# Patient Record
Sex: Male | Born: 1985 | Race: White | Hispanic: No | Marital: Married | Smoking: Former smoker
Health system: Southern US, Community
[De-identification: ages and names within clinical notes are randomized; demographics above are authoritative.]

## PROBLEM LIST (undated history)

## (undated) DIAGNOSIS — I456 Pre-excitation syndrome: Secondary | ICD-10-CM

## (undated) DIAGNOSIS — T7840XA Allergy, unspecified, initial encounter: Secondary | ICD-10-CM

## (undated) DIAGNOSIS — K5090 Crohn's disease, unspecified, without complications: Secondary | ICD-10-CM

## (undated) DIAGNOSIS — K219 Gastro-esophageal reflux disease without esophagitis: Secondary | ICD-10-CM

## (undated) HISTORY — PX: ANKLE SURGERY: SHX546

## (undated) HISTORY — DX: Gastro-esophageal reflux disease without esophagitis: K21.9

## (undated) HISTORY — PX: COLONOSCOPY: SHX5424

## (undated) HISTORY — DX: Allergy, unspecified, initial encounter: T78.40XA

## (undated) HISTORY — DX: Pre-excitation syndrome: I45.6

---

## 2009-06-10 ENCOUNTER — Encounter: Admission: RE | Admit: 2009-06-10 | Discharge: 2009-06-10 | Payer: Self-pay | Admitting: Emergency Medicine

## 2013-02-01 ENCOUNTER — Other Ambulatory Visit: Payer: Self-pay | Admitting: Gastroenterology

## 2013-02-01 DIAGNOSIS — R109 Unspecified abdominal pain: Secondary | ICD-10-CM

## 2013-02-08 ENCOUNTER — Ambulatory Visit
Admission: RE | Admit: 2013-02-08 | Discharge: 2013-02-08 | Disposition: A | Discharge status: Home / Self Care | Payer: BC Managed Care – PPO | Source: Ambulatory Visit | Attending: Gastroenterology | Admitting: Gastroenterology

## 2013-02-08 DIAGNOSIS — R109 Unspecified abdominal pain: Secondary | ICD-10-CM

## 2014-05-10 HISTORY — PX: BOWEL RESECTION: SHX1257

## 2014-05-16 ENCOUNTER — Ambulatory Visit (INDEPENDENT_AMBULATORY_CARE_PROVIDER_SITE_OTHER): Payer: BLUE CROSS/BLUE SHIELD | Admitting: Family Medicine

## 2014-05-16 ENCOUNTER — Encounter: Payer: Self-pay | Admitting: Family Medicine

## 2014-05-16 VITALS — BP 124/76 | HR 60 | Ht 71.0 in | Wt 199.0 lb

## 2014-05-16 DIAGNOSIS — G8929 Other chronic pain: Secondary | ICD-10-CM | POA: Insufficient documentation

## 2014-05-16 DIAGNOSIS — R1013 Epigastric pain: Secondary | ICD-10-CM

## 2014-05-16 MED ORDER — AMOXICILLIN 875 MG PO TABS: 875.0000 mg | ORAL_TABLET | Freq: Two times a day (BID) | ORAL | Status: DC

## 2014-05-16 MED ORDER — CLARITHROMYCIN 500 MG PO TABS: 500.0000 mg | ORAL_TABLET | Freq: Two times a day (BID) | ORAL | Status: DC

## 2014-05-16 MED ORDER — ESOMEPRAZOLE MAGNESIUM 40 MG PO CPDR: 40.0000 mg | DELAYED_RELEASE_CAPSULE | Freq: Every day | ORAL | Status: DC

## 2014-05-16 NOTE — Assessment & Plan Note (Signed)
Likely GERD with patient inconsistency takes medications. Due to pain, would feel treating for H pylori to be safe.  Will not get labs because patient states that he has had it before but never got the results and did set him back but initially. Patient is going to try this as well as taking Nexium at least 3 times a week after the treatment. Patient come back in 4 weeks for further evaluation. If patient continues to have difficulty we may need to consider another workup by gastroenterology.

## 2014-05-16 NOTE — Progress Notes (Signed)
  [facility] [place], [place] [address] Phone: [phone] Subjective:     CC: abdominal issues.   VZC:HYIFOYDXAJ Anthony Reed is a 29 y.o. 29 y.o. male coming in with complaint of  abdominal issues. Patient has had this intermittent for multiple years. Patient has had workup by gastroenterology before and did have a EGD done showing patient did have a hiatal hernia as well as some mild ulcers. Patient had been taking Nexium previously. Patient states from time to time he has an exacerbation where he has significant abdominal pain that does not allow him to do any activity. Patient states that this is more of a diffuse pain with no specific location. Patient states that it is severe as 8 out of 10 where he cannot even play with his kids. Denies any bowel changes. Sometimes associated with nausea as well as vomiting. States that these are fairly infrequent usually only 1-2 times a month. Patient has not notice any association with food or lack of food, alcohol, or medications. Patient does travel so his diet is inconsistent. Denies any fevers chills or any abnormal weight loss.     Past medical history, social, surgical and family history all reviewed in electronic medical record.   Review of Systems: No headache, visual changes, nausea, vomiting, diarrhea, constipation, dizziness, abdominal pain, skin rash, fevers, chills, night sweats, weight loss, swollen lymph nodes, body aches, joint swelling, muscle aches, chest pain, shortness of breath, mood changes.   Objective Blood pressure 124/76, pulse 60, height 5' 11"  (1.803 m), weight 199 lb (90.266 kg), SpO2 99 %.  General: No apparent distress alert and oriented x3 mood and affect normal, dressed appropriately.  HEENT: Pupils equal, extraocular movements intact  Respiratory: Patient's speak in full sentences and does not appear short of breath  Cardiovascular: No lower extremity edema, non tender, no erythema    Skin: Warm dry intact with no signs of infection or rash on extremities or on axial skeleton.  Abdomen: Soft nontender, BS +, No masses palpated.  Neuro: Cranial nerves II through XII are intact, neurovascularly intact in all extremities with 2+ DTRs and 2+ pulses.  Lymph: No lymphadenopathy of posterior or anterior cervical chain or axillae bilaterally.  Gait normal with good balance and coordination.  MSK:  Non tender with full range of motion and good stability and symmetric strength and tone of shoulders, elbows, wrist, hip, knee and ankles bilaterally.     Impression and Recommendations:     This case required medical decision making of moderate complexity.

## 2014-05-16 NOTE — Patient Instructions (Addendum)
Good to meet you.  We will treat for H pylori for next 2 weeks.  Nexium then daily for 2 weeks after that then 3 times weekly.  Either come back and see me or call in 4 weeks and tell me how you are doing.

## 2014-09-19 LAB — HM COLONOSCOPY

## 2014-11-02 IMAGING — US US ABDOMEN COMPLETE
1 series · 13 of 25 positions shown · non-contrast
Comparison: None.

ADDENDUM:
The impression is incorrect. There was slight increased echogenicity
of the parenchyma of the liver compared to the right kidney. This
may be associated with hepatic steatosis but is not specific for it.
CLINICAL DATA: History of abdominal pain.

EXAM:
ULTRASOUND ABDOMEN COMPLETE

[Series 1: us abdomen complete · 0.24mm/px · 13 of 82 slices shown]
[im 1/82]
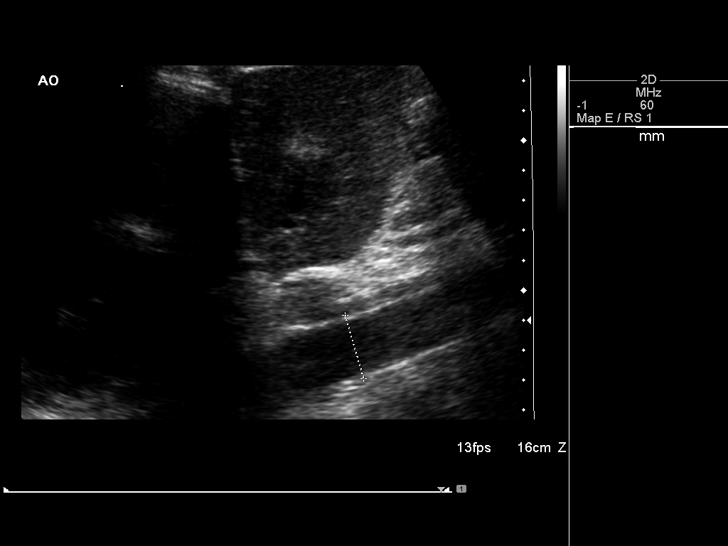
[im 7/82]
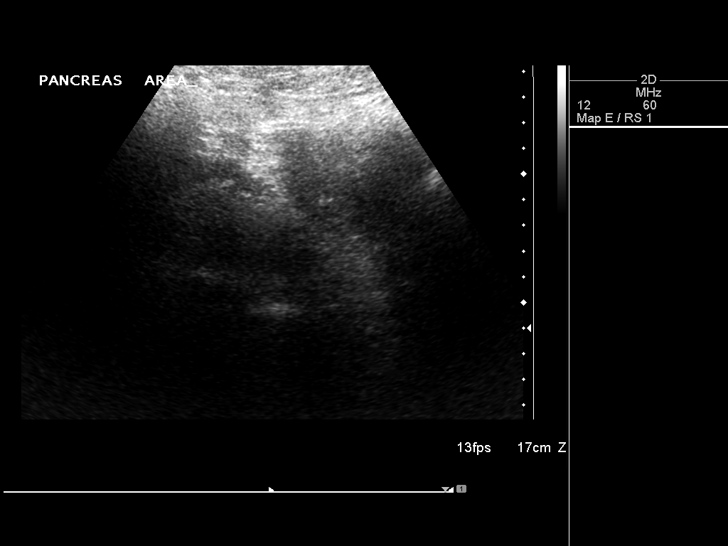
[im 14/82]
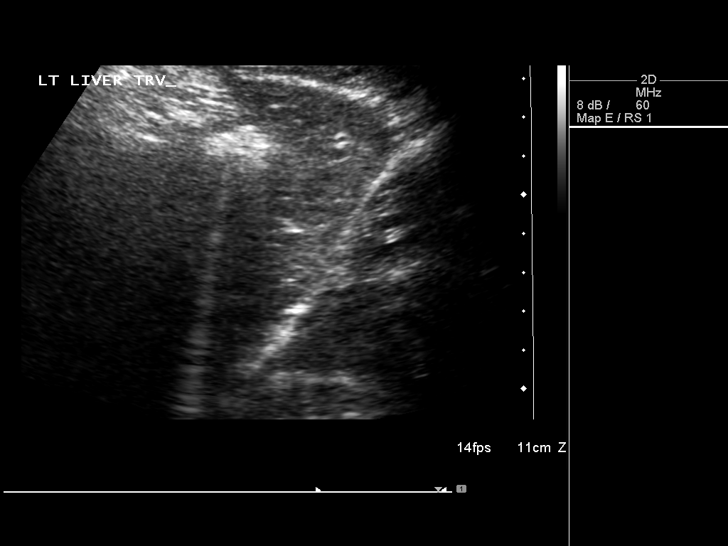
[im 21/82]
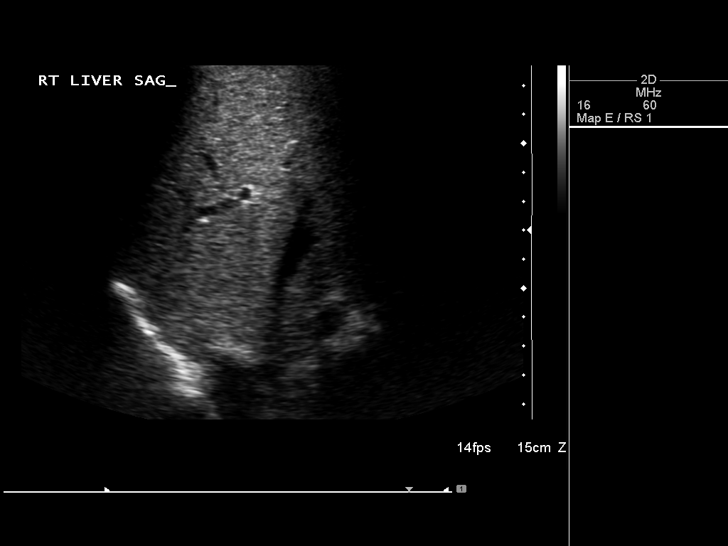
[im 28/82]
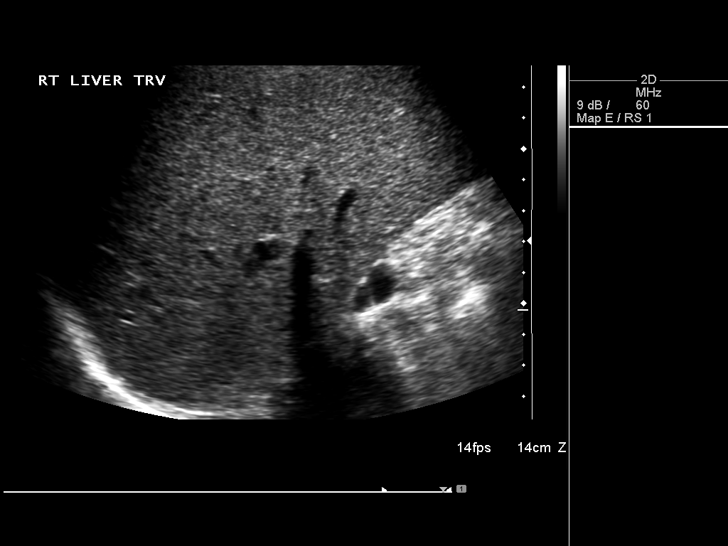
[im 34/82]
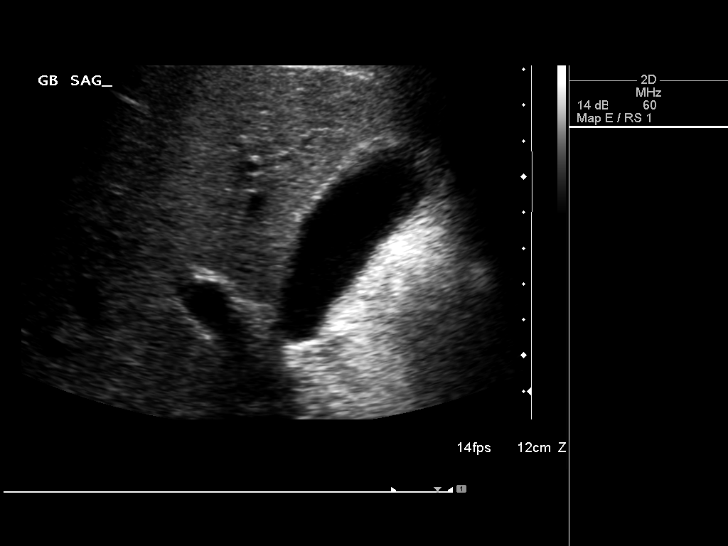
[im 41/82]
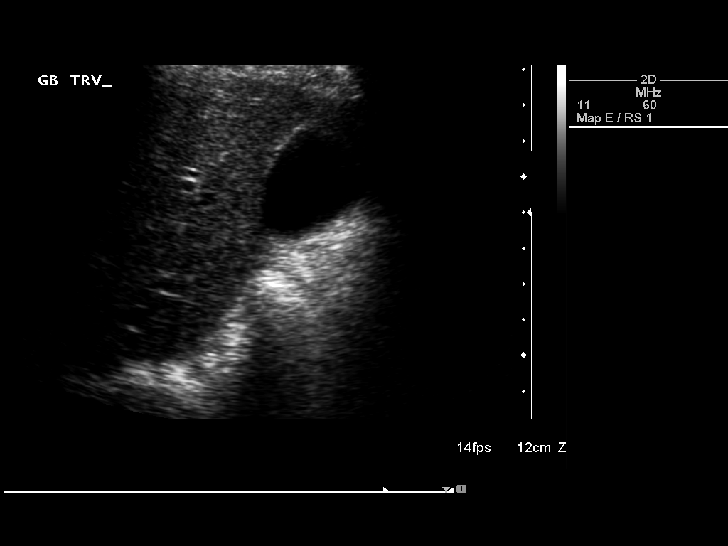
[im 48/82]
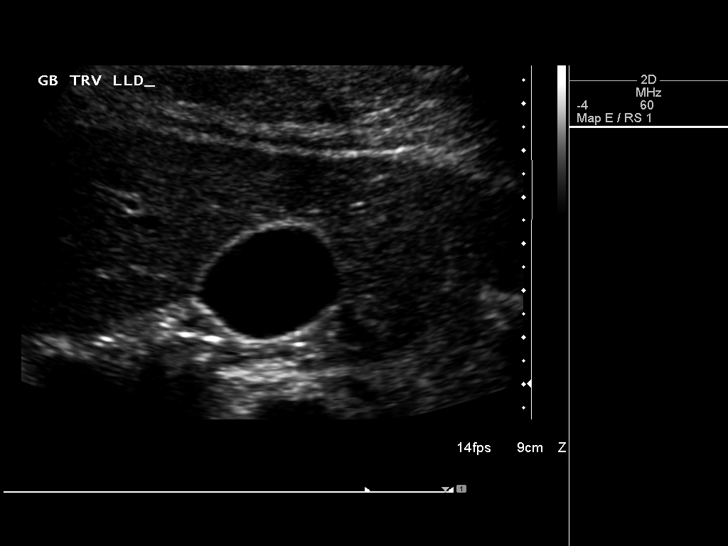
[im 55/82]
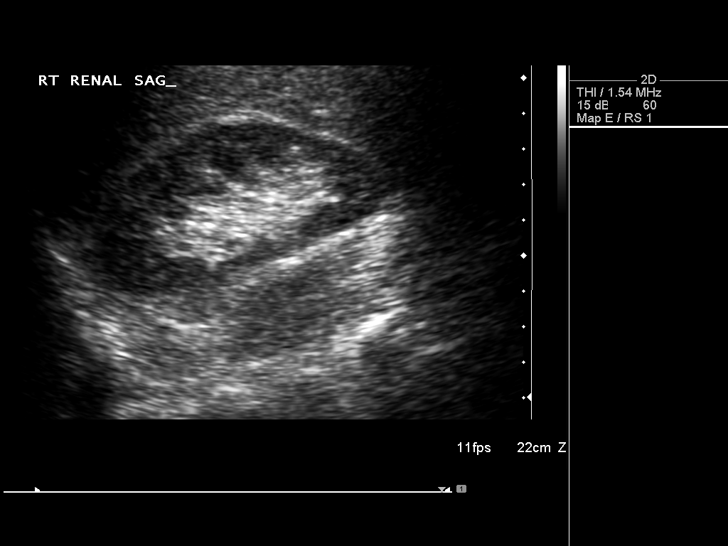
[im 61/82]
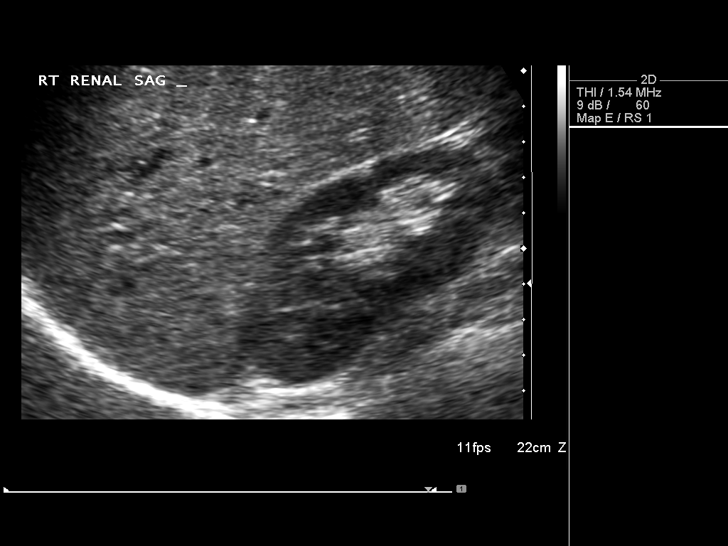
[im 68/82]
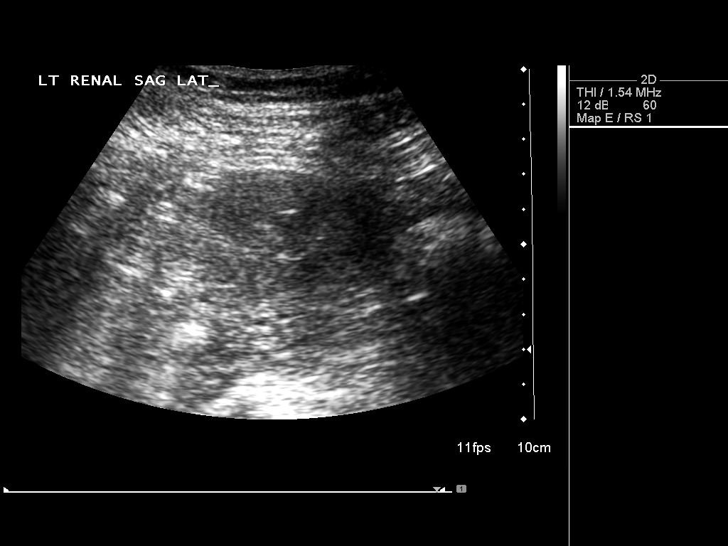
[im 75/82]
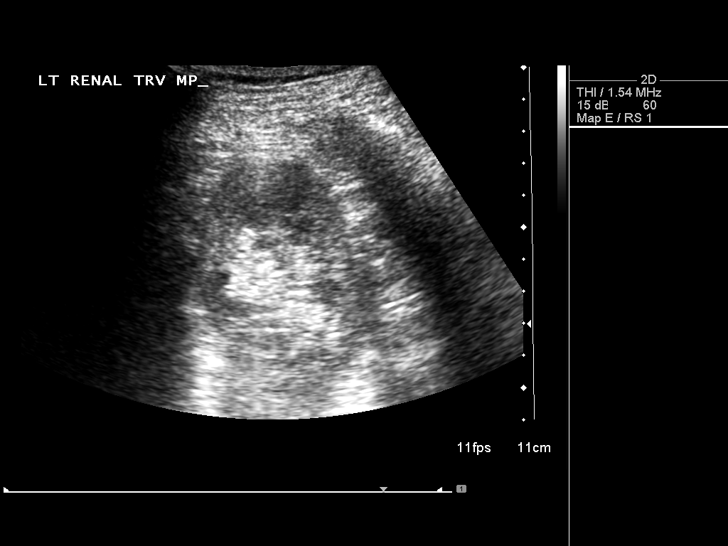
[im 82/82]
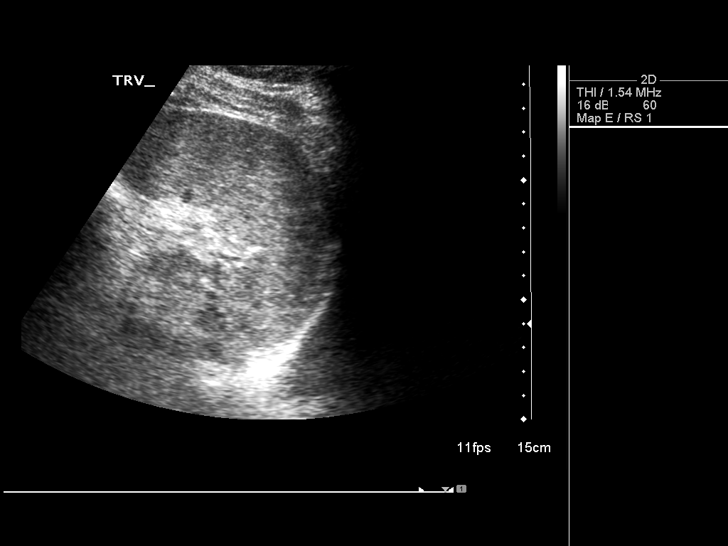

[13 of 25 positions shown; findings below may reference images not displayed]

FINDINGS: Gallbladder

No cholelithiasis is evident. Gallbladder wall thickness is normal
measuring 1.8 cm. No pericholecystic fluid is seen. There was a
negative sonographic Murphy's sign according to technologist

Common bile duct

Diameter: Common bile duct measured 3 mm. No choledocholithiasis is
evident.

Liver

No focal lesion identified. There is increased echogenicity of the
parenchyma of the liver compared to the right kidney. Portal vein is
patent with hepatopetal flow.

IVC

No abnormality visualized. Some portions were obscured by bowel gas.

Pancreas

Pancreatic tissue was obscured by overlying bowel gas.

Spleen

Size and appearance within normal limits. Length is 9.1 cm. No focal
abnormality is identified.

Right Kidney

Length: Right renal length is 9.8 cm. Echogenicity within normal
limits. No mass or hydronephrosis visualized.

Left Kidney

Length: Left renal length is 10.2 cm. Echogenicity within normal
limits. No mass or hydronephrosis visualized.

Abdominal aorta

No aneurysm visualized.  Maximum diameter is 2.2 cm.
IMPRESSION: Some portions of the abdomen were obscured by bowel gas. No
abdominal pathology is identified.

## 2015-03-24 NOTE — Progress Notes (Signed)
 Small amount of nausea but no more emesis today. Having small bowel movements. Abdomen is benign. Based on CT scan and history, wondering whether he may have early recurrent Crohn's disease of the small bowel. Tomorrow going to order an MR enterography to further evaluate. He may wire initiation of steroids or other Crohn's medications.

## 2015-04-02 LAB — HEPATITIS B SURFACE ANTIBODY,QUALITATIVE
HBsAg Screen: NEGATIVE
Hep B Surface Ab, Qual: REACTIVE

## 2015-05-01 ENCOUNTER — Other Ambulatory Visit: Payer: Self-pay | Admitting: Family Medicine

## 2015-05-01 NOTE — Telephone Encounter (Signed)
Refill denied. Pt has not been seen since January 2016.

## 2015-05-27 ENCOUNTER — Other Ambulatory Visit: Payer: Self-pay | Admitting: Family Medicine

## 2015-05-27 NOTE — Telephone Encounter (Signed)
Pt has not been seen within a year.  Refill denied.

## 2015-09-16 ENCOUNTER — Encounter (HOSPITAL_COMMUNITY): Payer: Self-pay

## 2015-09-16 ENCOUNTER — Emergency Department (HOSPITAL_COMMUNITY)
Admission: EM | Admit: 2015-09-16 | Discharge: 2015-09-16 | Disposition: A | Discharge status: Home / Self Care | Payer: BLUE CROSS/BLUE SHIELD | Attending: Emergency Medicine | Admitting: Emergency Medicine

## 2015-09-16 DIAGNOSIS — K5090 Crohn's disease, unspecified, without complications: Secondary | ICD-10-CM

## 2015-09-16 DIAGNOSIS — R109 Unspecified abdominal pain: Secondary | ICD-10-CM | POA: Diagnosis present

## 2015-09-16 DIAGNOSIS — R17 Unspecified jaundice: Secondary | ICD-10-CM

## 2015-09-16 DIAGNOSIS — Z79899 Other long term (current) drug therapy: Secondary | ICD-10-CM | POA: Diagnosis not present

## 2015-09-16 HISTORY — DX: Crohn's disease, unspecified, without complications: K50.90

## 2015-09-16 LAB — COMPREHENSIVE METABOLIC PANEL
ALBUMIN: 4.5 g/dL (ref 3.5–5.0)
ALK PHOS: 55 U/L (ref 38–126)
ALT: 26 U/L (ref 17–63)
ANION GAP: 13 (ref 5–15)
AST: 28 U/L (ref 15–41)
BILIRUBIN TOTAL: 3 mg/dL — AB (ref 0.3–1.2)
BUN: 17 mg/dL (ref 6–20)
CALCIUM: 9.6 mg/dL (ref 8.9–10.3)
CO2: 22 mmol/L (ref 22–32)
CREATININE: 1.15 mg/dL (ref 0.61–1.24)
Chloride: 107 mmol/L (ref 101–111)
GFR calc Af Amer: >60 mL/min (ref >60)
GFR calc non Af Amer: >60 mL/min (ref >60)
GLUCOSE: 88 mg/dL (ref 65–99)
Potassium: 4 mmol/L (ref 3.5–5.1)
Sodium: 142 mmol/L (ref 135–145)
TOTAL PROTEIN: 7 g/dL (ref 6.5–8.1)

## 2015-09-16 LAB — CBC
HCT: 44.8 % (ref 39.0–52.0)
Hemoglobin: 15.6 g/dL (ref 13.0–17.0)
MCH: 29.2 pg (ref 26.0–34.0)
MCHC: 34.8 g/dL (ref 30.0–36.0)
MCV: 83.9 fL (ref 78.0–100.0)
PLATELETS: 250 10*3/uL (ref 150–400)
RBC: 5.34 MIL/uL (ref 4.22–5.81)
RDW: 14.2 % (ref 11.5–15.5)
WBC: 8.2 10*3/uL (ref 4.0–10.5)

## 2015-09-16 LAB — LIPASE, BLOOD: Lipase: 29 U/L (ref 11–51)

## 2015-09-16 MED ORDER — FENTANYL CITRATE (PF) 100 MCG/2ML IJ SOLN
50.0000 ug | INTRAMUSCULAR | Status: DC | PRN
  Administered 2015-09-16: 50 ug via NASAL

## 2015-09-16 MED ORDER — KETOROLAC TROMETHAMINE 15 MG/ML IJ SOLN
15.0000 mg | Freq: Once | INTRAMUSCULAR | Status: AC
  Administered 2015-09-16: 15 mg via INTRAVENOUS
  Filled 2015-09-16: qty 1

## 2015-09-16 MED ORDER — FENTANYL CITRATE (PF) 100 MCG/2ML IJ SOLN
INTRAMUSCULAR | Status: AC
  Filled 2015-09-16: qty 2

## 2015-09-16 MED ORDER — HYDROMORPHONE HCL 1 MG/ML IJ SOLN
1.0000 mg | Freq: Once | INTRAMUSCULAR | Status: AC
Start: 2015-09-16 — End: 2015-09-16
  Administered 2015-09-16: 1 mg via INTRAVENOUS
  Filled 2015-09-16: qty 1

## 2015-09-16 MED ORDER — ONDANSETRON 4 MG PO TBDP
4.0000 mg | ORAL_TABLET | Freq: Once | ORAL | Status: AC | PRN
  Administered 2015-09-16: 4 mg via ORAL

## 2015-09-16 MED ORDER — SODIUM CHLORIDE 0.9 % IV BOLUS (SEPSIS)
1000.0000 mL | Freq: Once | INTRAVENOUS | Status: AC
  Administered 2015-09-16: 1000 mL via INTRAVENOUS

## 2015-09-16 MED ORDER — KETOROLAC TROMETHAMINE 10 MG PO TABS: 10.0000 mg | ORAL_TABLET | Freq: Four times a day (QID) | ORAL | Status: DC | PRN

## 2015-09-16 MED ORDER — OXYCODONE-ACETAMINOPHEN 5-325 MG PO TABS
1.0000 | ORAL_TABLET | Freq: Once | ORAL | Status: AC
  Administered 2015-09-16: 1 via ORAL
  Filled 2015-09-16: qty 1

## 2015-09-16 MED ORDER — ONDANSETRON 4 MG PO TBDP
ORAL_TABLET | ORAL | Status: AC
  Filled 2015-09-16: qty 1

## 2015-09-16 NOTE — ED Notes (Signed)
Pt here with c/o all over abdominal pain/cramping, onset 2 hours ago. He has Crohn's disease and this flare up much worse. He reports nausea but denies v/D.

## 2015-09-16 NOTE — Discharge Instructions (Signed)
Take the meds prescribed. All the ER labs are reassuring.  Please return to the ER if your symptoms worsen; you have increased pain, fevers, chills, inability to keep any medications down. Otherwise see the outpatient doctor as requested.   Crohn Disease Crohn disease is a long-lasting (chronic) disease that affects your gastrointestinal (GI) tract. It often causes irritation and swelling (inflammation) in your small intestine and the beginning of your large intestine. However, it can affect any part of your GI tract. Crohn disease is part of a group of illnesses that are known as inflammatory bowel disease (IBD). Crohn disease may start slowly and get worse over time. Symptoms may come and go. They may also disappear for months or even years at a time (remission). CAUSES The exact cause of Crohn disease is not known. It may be a response that causes your body's defense system (immune system) to mistakenly attack healthy cells and tissues (autoimmune response). Your genes and your environment may also play a role. RISK FACTORS You may be at greater risk for Crohn disease if you:  Have other family members with Crohn disease or another IBD.  Use any tobacco products, including cigarettes, chewing tobacco, or electronic cigarettes.  Are in your 7s.  Have [place] European ancestry. SIGNS AND SYMPTOMS The main signs and symptoms of Crohn disease involve your GI tract. These include:  Diarrhea.  Rectal bleeding.  An urgent need to move your bowels.  The feeling that you are not finished having a bowel movement.  Abdominal pain or cramping.  Constipation. General signs and symptoms of Crohn disease may also include:  Unexplained weight loss.  Fatigue.  Fever.  Nausea.  Loss of appetite.  Joint pain  Changes in vision.  Red bumps on your skin. DIAGNOSIS Your health care provider may suspect Crohn disease based on your symptoms and your medical history. Your health care  provider will do a physical exam. You may need to see a health care provider who specializes in diseases of the digestive tract (gastroenterologist). You may also have tests to help your health care providers make a diagnosis. These may include:  Blood tests.  Stool sample tests.  Imaging tests, such as X-rays and CT scans.  Tests to examine the inside of your intestines using a long, flexible tube that has a light and a camera on the end (endoscopy or colonoscopy).  A procedure to take tissue samples from inside your bowel (biopsy) to be examined under a microscope. TREATMENT  There is no cure for Crohn disease. Treatment will focus on managing your symptoms. Crohn disease affects each person differently. Your treatment may include:  Resting your bowels. Drinking only clear liquids or getting nutrition through an IV for a period of time gives your bowels a chance to heal because they are not passing stools.  Medicines. These may be used alone or in combination (combination therapy). These may include antibiotic medicines. You may be given medicines that help to:  Reduce inflammation.  Control your immune system activity.  Fight infections.  Relieve cramps and prevent diarrhea.  Control your pain.  Surgery. You may need surgery if:  Medicines and other treatments are no longer working.  You develop complications from severe Crohn disease.  A section of your intestine becomes so damaged that it needs to be removed. HOME CARE INSTRUCTIONS  Take medicines only as directed by your health care provider.  If you were prescribed an antibiotic medicine, finish it all even if you start to  feel better.  Keep all follow-up visits as directed by your health care provider. This is important.  Talk with your health care provider about changing your diet. This may help your symptoms. Your health care provide may recommend changes, such as:  Drinking more fluids.  Avoiding milk and  other foods that contain lactose.  Eating a low-fat diet.  Avoiding high-fiber foods, such as popcorn and nuts.  Avoiding carbonated beverages, such as soda.  Eating smaller meals more often rather than eating large meals.  Keeping a food diary to identify foods that make your symptoms better or worse.  Do not use any tobacco products, including cigarettes, chewing tobacco, or electronic cigarettes. If you need help quitting, ask your health care provider.  Limit alcohol intake to no more than 1 drink per day for nonpregnant women and 2 drinks per day for men. One drink equals 12 ounces of beer, 5 ounces of wine, or 1 ounces of hard liquor.  Exercise daily or as directed by your health care provider. SEEK MEDICAL CARE IF:  You have diarrhea, abdominal cramps, and other gastrointestinal problems that are present almost all of the time.  Your symptoms do not improve with treatment.  You continue to lose weight.  You develop a rash or sores on your skin.  You develop eye problems.  You have a fever.   Your symptoms get worse.  You develop new symptoms. SEEK IMMEDIATE MEDICAL CARE IF:  You have bloody diarrhea.  You develop severe abdominal pain.  You cannot pass stools.   This information is not intended to replace advice given to you by your health care provider. Make sure you discuss any questions you have with your health care provider.   Document Released: 02/03/2005 Document Revised: 05/17/2014 Document Reviewed: 12/12/2013 Elsevier Interactive Patient Education [employer].

## 2015-09-16 NOTE — ED Provider Notes (Signed)
CSN: [id number]     Arrival date & time 09/16/15  1646 History   First MD Initiated Contact with Patient 09/16/15 1911     Chief Complaint  Patient presents with  . Abdominal Pain     (Consider location/radiation/quality/duration/timing/severity/associated sxs/prior Treatment) HPI Comments: Pt comes in with cc of abd pain. Abd pain started around 2:30, unprovoked. Pain is described as sharp pain, non radiating. No associated n/v/f/c/diarrhea. Pt has hx of crohns, managed at [facility]. Pt has had 2 admissions in the last 6 months and colon resection. Not on chronic pain meds. Pt is on humira.   ROS 10 Systems reviewed and are negative for acute change except as noted in the HPI.     Patient is a 30 y.o. male presenting with abdominal pain. The history is provided by the patient.  Abdominal Pain   Past Medical History  Diagnosis Date  . Crohn disease ([place])    History reviewed. No pertinent past surgical history. No family history on file. Social History  Substance Use Topics  . Smoking status: Never Smoker   . Smokeless tobacco: None  . Alcohol Use: None    Review of Systems  Gastrointestinal: Positive for abdominal pain.      Allergies  Review of patient's allergies indicates no known allergies.  Home Medications   Prior to Admission medications   Medication Sig Start Date End Date Taking? Authorizing Provider  Adalimumab (HUMIRA) 40 MG/0.8ML PSKT Inject 40 mg into the skin every 14 (fourteen) days.   Yes Historical Provider, MD  Ascorbic Acid (VITAMIN C PO) Take 1 tablet by mouth daily.   Yes Historical Provider, MD  esomeprazole (NEXIUM) 40 MG capsule Take 1 capsule (40 mg total) by mouth daily. 05/16/14  Yes Lyndal Pulley, DO  Omega-3 Fatty Acids (FISH OIL PO) Take 1 capsule by mouth daily.   Yes Historical Provider, MD  Probiotic Product (PROBIOTIC PO) Take 1 tablet by mouth daily.   Yes Historical Provider, MD  ketorolac (TORADOL) 10 MG tablet Take 1  tablet (10 mg total) by mouth every 6 (six) hours as needed. 09/16/15   My Madariaga, MD   BP 181/100 mmHg  Pulse 62  Temp(Src) 98.8 F (37.1 C) (Oral)  Resp 14  Ht 5' 11"  (1.803 m)  Wt 190 lb (86.183 kg)  BMI 26.51 kg/m2  SpO2 100% Physical Exam  Constitutional: He is oriented to person, place, and time. He appears well-developed.  HENT:  Head: Atraumatic.  Neck: Neck supple.  Cardiovascular: Normal rate.   Pulmonary/Chest: Effort normal.  Abdominal: Soft. Bowel sounds are normal. There is tenderness. There is no rebound and no guarding.  Periumbilical tenderness  Neurological: He is alert and oriented to person, place, and time.  Skin: Skin is warm.  Nursing note and vitals reviewed.   ED Course  Procedures (including critical care time) Labs Review Labs Reviewed  COMPREHENSIVE METABOLIC PANEL - Abnormal; Notable for the following:    Total Bilirubin 3.0 (*)    All other components within normal limits  LIPASE, BLOOD  CBC  URINALYSIS, ROUTINE W REFLEX MICROSCOPIC (NOT AT [facility])    Imaging Review No results found. I have personally reviewed and evaluated these images and lab results as part of my medical decision-making.   EKG Interpretation None      MDM   Final diagnoses:  Crohn's disease without complication, unspecified gastrointestinal tract location ([place])  Elevated bilirubin    Pt comes in with cc of abd pain.  Abd pain is periumbilical, no peritoneal signs. VSS and WNL, and the labs are benign except for elevated bilirubin which is not specific. Based on the labs and exam - no indication for CT currently. Will give pain meds, hydrate and reassess.  9:34 PM Pt is a lot comfortable and essentially pain free. Will d/c. Strict ER return precautions have been discussed, and patient is agreeing with the plan and is comfortable with the workup done and the recommendations from the ER.      Varney Biles, MD 09/16/15 2134

## 2015-09-16 NOTE — ED Notes (Signed)
PAIN MEDICATION ADMINISTERED IN TRIAGE AND PT INSTRUCTED NO DRINKING, DRIVING OR OPERATING HEAVY MACHINERY WITH 4-6 HOURS. POT MOM WILL DRIVE HIM HOME IF DISCHARGED

## 2016-03-24 LAB — IRON,TIBC AND FERRITIN PANEL
%SAT: 20
Ferritin: 20
Iron: 78
TIBC: 388
UIBC: 310

## 2016-03-24 LAB — C-REACTIVE PROTEIN, QUANT: C-Reactive Protein, Quant: 0.4

## 2016-03-24 LAB — FOLATE: Folic Acid: 15.6

## 2016-03-24 LAB — HEPATIC FUNCTION PANEL
ALT: 26 (ref 10–40)
AST: 33 (ref 14–40)
Alkaline Phosphatase: 64 (ref 25–125)
Bilirubin, Total: 1.8

## 2016-03-24 LAB — BASIC METABOLIC PANEL
BUN: 19 (ref 4–21)
Creatinine: 1.1 (ref 0.6–1.3)
Glucose: 84
Potassium: 4.7 (ref 3.4–5.3)
Sodium: 141 (ref 137–147)

## 2016-03-24 LAB — VITAMIN B12: Vitamin B-12: 332

## 2016-03-24 LAB — CBC AND DIFFERENTIAL
HCT: 44 (ref 41–53)
Hemoglobin: 14.9 (ref 13.5–17.5)
Neutrophils Absolute: 5
Platelets: 275 (ref 150–399)
WBC: 9

## 2017-02-22 DIAGNOSIS — K921 Melena: Secondary | ICD-10-CM | POA: Diagnosis not present

## 2017-03-26 DIAGNOSIS — K625 Hemorrhage of anus and rectum: Secondary | ICD-10-CM | POA: Diagnosis not present

## 2017-03-26 DIAGNOSIS — R109 Unspecified abdominal pain: Secondary | ICD-10-CM | POA: Diagnosis not present

## 2017-03-26 DIAGNOSIS — R1084 Generalized abdominal pain: Secondary | ICD-10-CM | POA: Diagnosis not present

## 2017-03-26 DIAGNOSIS — K219 Gastro-esophageal reflux disease without esophagitis: Secondary | ICD-10-CM | POA: Diagnosis not present

## 2017-03-26 DIAGNOSIS — R112 Nausea with vomiting, unspecified: Secondary | ICD-10-CM | POA: Diagnosis not present

## 2017-03-26 DIAGNOSIS — K449 Diaphragmatic hernia without obstruction or gangrene: Secondary | ICD-10-CM | POA: Diagnosis not present

## 2017-03-26 DIAGNOSIS — Z79899 Other long term (current) drug therapy: Secondary | ICD-10-CM | POA: Diagnosis not present

## 2017-03-26 DIAGNOSIS — Z87891 Personal history of nicotine dependence: Secondary | ICD-10-CM | POA: Diagnosis not present

## 2017-03-26 DIAGNOSIS — K921 Melena: Secondary | ICD-10-CM | POA: Diagnosis not present

## 2017-03-26 DIAGNOSIS — K50912 Crohn's disease, unspecified, with intestinal obstruction: Secondary | ICD-10-CM | POA: Diagnosis not present

## 2017-03-26 DIAGNOSIS — K50012 Crohn's disease of small intestine with intestinal obstruction: Secondary | ICD-10-CM | POA: Diagnosis not present

## 2017-03-26 DIAGNOSIS — Z4682 Encounter for fitting and adjustment of non-vascular catheter: Secondary | ICD-10-CM | POA: Diagnosis not present

## 2017-03-26 DIAGNOSIS — K56600 Partial intestinal obstruction, unspecified as to cause: Secondary | ICD-10-CM | POA: Diagnosis not present

## 2017-05-16 DIAGNOSIS — K5090 Crohn's disease, unspecified, without complications: Secondary | ICD-10-CM | POA: Diagnosis not present

## 2017-05-16 DIAGNOSIS — R509 Fever, unspecified: Secondary | ICD-10-CM | POA: Diagnosis not present

## 2017-05-16 DIAGNOSIS — K56609 Unspecified intestinal obstruction, unspecified as to partial versus complete obstruction: Secondary | ICD-10-CM | POA: Diagnosis not present

## 2017-05-16 DIAGNOSIS — K56600 Partial intestinal obstruction, unspecified as to cause: Secondary | ICD-10-CM | POA: Diagnosis not present

## 2017-05-16 DIAGNOSIS — K6389 Other specified diseases of intestine: Secondary | ICD-10-CM | POA: Diagnosis not present

## 2017-05-16 DIAGNOSIS — R109 Unspecified abdominal pain: Secondary | ICD-10-CM | POA: Diagnosis not present

## 2017-05-16 DIAGNOSIS — Z9049 Acquired absence of other specified parts of digestive tract: Secondary | ICD-10-CM | POA: Diagnosis not present

## 2017-05-16 DIAGNOSIS — K219 Gastro-esophageal reflux disease without esophagitis: Secondary | ICD-10-CM | POA: Diagnosis not present

## 2017-05-16 DIAGNOSIS — K449 Diaphragmatic hernia without obstruction or gangrene: Secondary | ICD-10-CM | POA: Diagnosis not present

## 2017-05-16 DIAGNOSIS — Z87891 Personal history of nicotine dependence: Secondary | ICD-10-CM | POA: Diagnosis not present

## 2017-05-16 DIAGNOSIS — R1013 Epigastric pain: Secondary | ICD-10-CM | POA: Diagnosis not present

## 2017-05-16 DIAGNOSIS — K50919 Crohn's disease, unspecified, with unspecified complications: Secondary | ICD-10-CM | POA: Diagnosis not present

## 2017-05-16 DIAGNOSIS — Z79899 Other long term (current) drug therapy: Secondary | ICD-10-CM | POA: Diagnosis not present

## 2017-05-16 DIAGNOSIS — R112 Nausea with vomiting, unspecified: Secondary | ICD-10-CM | POA: Diagnosis not present

## 2017-05-27 DIAGNOSIS — K921 Melena: Secondary | ICD-10-CM | POA: Diagnosis not present

## 2017-05-27 DIAGNOSIS — K50012 Crohn's disease of small intestine with intestinal obstruction: Secondary | ICD-10-CM | POA: Diagnosis not present

## 2017-05-27 DIAGNOSIS — K219 Gastro-esophageal reflux disease without esophagitis: Secondary | ICD-10-CM | POA: Diagnosis not present

## 2017-06-20 DIAGNOSIS — K56609 Unspecified intestinal obstruction, unspecified as to partial versus complete obstruction: Secondary | ICD-10-CM | POA: Diagnosis not present

## 2017-06-20 DIAGNOSIS — Z79899 Other long term (current) drug therapy: Secondary | ICD-10-CM | POA: Diagnosis not present

## 2017-06-20 DIAGNOSIS — K50812 Crohn's disease of both small and large intestine with intestinal obstruction: Secondary | ICD-10-CM | POA: Diagnosis not present

## 2017-06-20 DIAGNOSIS — Z9049 Acquired absence of other specified parts of digestive tract: Secondary | ICD-10-CM | POA: Diagnosis not present

## 2017-07-21 DIAGNOSIS — K56600 Partial intestinal obstruction, unspecified as to cause: Secondary | ICD-10-CM | POA: Diagnosis not present

## 2017-07-21 DIAGNOSIS — K50019 Crohn's disease of small intestine with unspecified complications: Secondary | ICD-10-CM | POA: Diagnosis not present

## 2017-07-25 DIAGNOSIS — K50019 Crohn's disease of small intestine with unspecified complications: Secondary | ICD-10-CM | POA: Diagnosis not present

## 2017-07-27 DIAGNOSIS — N451 Epididymitis: Secondary | ICD-10-CM | POA: Diagnosis not present

## 2017-07-27 DIAGNOSIS — R300 Dysuria: Secondary | ICD-10-CM | POA: Diagnosis not present

## 2017-07-27 DIAGNOSIS — N50819 Testicular pain, unspecified: Secondary | ICD-10-CM | POA: Diagnosis not present

## 2017-07-27 DIAGNOSIS — K5900 Constipation, unspecified: Secondary | ICD-10-CM | POA: Diagnosis not present

## 2017-07-27 DIAGNOSIS — R109 Unspecified abdominal pain: Secondary | ICD-10-CM | POA: Diagnosis not present

## 2017-07-28 DIAGNOSIS — N50819 Testicular pain, unspecified: Secondary | ICD-10-CM | POA: Diagnosis not present

## 2017-08-01 DIAGNOSIS — Z87891 Personal history of nicotine dependence: Secondary | ICD-10-CM | POA: Diagnosis not present

## 2017-08-01 DIAGNOSIS — K449 Diaphragmatic hernia without obstruction or gangrene: Secondary | ICD-10-CM | POA: Diagnosis not present

## 2017-08-01 DIAGNOSIS — K50012 Crohn's disease of small intestine with intestinal obstruction: Secondary | ICD-10-CM | POA: Diagnosis not present

## 2017-08-01 DIAGNOSIS — K219 Gastro-esophageal reflux disease without esophagitis: Secondary | ICD-10-CM | POA: Diagnosis not present

## 2017-08-01 DIAGNOSIS — K289 Gastrojejunal ulcer, unspecified as acute or chronic, without hemorrhage or perforation: Secondary | ICD-10-CM | POA: Diagnosis not present

## 2017-08-26 DIAGNOSIS — J029 Acute pharyngitis, unspecified: Secondary | ICD-10-CM | POA: Diagnosis not present

## 2017-08-26 DIAGNOSIS — J0100 Acute maxillary sinusitis, unspecified: Secondary | ICD-10-CM | POA: Diagnosis not present

## 2018-08-17 DIAGNOSIS — R197 Diarrhea, unspecified: Secondary | ICD-10-CM | POA: Diagnosis not present

## 2018-08-17 DIAGNOSIS — K50812 Crohn's disease of both small and large intestine with intestinal obstruction: Secondary | ICD-10-CM | POA: Diagnosis not present

## 2018-08-17 DIAGNOSIS — K56600 Partial intestinal obstruction, unspecified as to cause: Secondary | ICD-10-CM | POA: Diagnosis not present

## 2018-08-18 DIAGNOSIS — K50812 Crohn's disease of both small and large intestine with intestinal obstruction: Secondary | ICD-10-CM | POA: Diagnosis not present

## 2018-08-18 DIAGNOSIS — K56600 Partial intestinal obstruction, unspecified as to cause: Secondary | ICD-10-CM | POA: Diagnosis not present

## 2018-08-18 DIAGNOSIS — R197 Diarrhea, unspecified: Secondary | ICD-10-CM | POA: Diagnosis not present

## 2018-08-18 DIAGNOSIS — E611 Iron deficiency: Secondary | ICD-10-CM | POA: Diagnosis not present

## 2018-08-18 DIAGNOSIS — K921 Melena: Secondary | ICD-10-CM | POA: Diagnosis not present

## 2018-08-28 DIAGNOSIS — K921 Melena: Secondary | ICD-10-CM | POA: Diagnosis not present

## 2018-08-28 DIAGNOSIS — E611 Iron deficiency: Secondary | ICD-10-CM | POA: Diagnosis not present

## 2018-08-28 DIAGNOSIS — K50812 Crohn's disease of both small and large intestine with intestinal obstruction: Secondary | ICD-10-CM | POA: Diagnosis not present

## 2018-09-11 DIAGNOSIS — E611 Iron deficiency: Secondary | ICD-10-CM | POA: Diagnosis not present

## 2018-09-18 DIAGNOSIS — D509 Iron deficiency anemia, unspecified: Secondary | ICD-10-CM | POA: Diagnosis not present

## 2018-09-18 DIAGNOSIS — E611 Iron deficiency: Secondary | ICD-10-CM | POA: Diagnosis not present

## 2018-10-30 DIAGNOSIS — E611 Iron deficiency: Secondary | ICD-10-CM | POA: Diagnosis not present

## 2018-10-30 DIAGNOSIS — K56600 Partial intestinal obstruction, unspecified as to cause: Secondary | ICD-10-CM | POA: Diagnosis not present

## 2018-10-30 DIAGNOSIS — D509 Iron deficiency anemia, unspecified: Secondary | ICD-10-CM | POA: Diagnosis not present

## 2018-10-30 DIAGNOSIS — K50019 Crohn's disease of small intestine with unspecified complications: Secondary | ICD-10-CM | POA: Diagnosis not present

## 2018-11-30 DIAGNOSIS — E611 Iron deficiency: Secondary | ICD-10-CM | POA: Diagnosis not present

## 2018-11-30 DIAGNOSIS — K50019 Crohn's disease of small intestine with unspecified complications: Secondary | ICD-10-CM | POA: Diagnosis not present

## 2018-11-30 DIAGNOSIS — D509 Iron deficiency anemia, unspecified: Secondary | ICD-10-CM | POA: Diagnosis not present

## 2018-12-12 ENCOUNTER — Other Ambulatory Visit: Payer: Self-pay

## 2018-12-12 ENCOUNTER — Ambulatory Visit (INDEPENDENT_AMBULATORY_CARE_PROVIDER_SITE_OTHER): Payer: BC Managed Care – PPO | Admitting: Family Medicine

## 2018-12-12 ENCOUNTER — Encounter: Payer: Self-pay | Admitting: Family Medicine

## 2018-12-12 VITALS — BP 128/88 | HR 59 | Temp 97.7°F | Ht 70.5 in | Wt 208.2 lb

## 2018-12-12 DIAGNOSIS — E663 Overweight: Secondary | ICD-10-CM

## 2018-12-12 DIAGNOSIS — Z0001 Encounter for general adult medical examination with abnormal findings: Secondary | ICD-10-CM

## 2018-12-12 DIAGNOSIS — Z6829 Body mass index (BMI) 29.0-29.9, adult: Secondary | ICD-10-CM

## 2018-12-12 DIAGNOSIS — I456 Pre-excitation syndrome: Secondary | ICD-10-CM | POA: Diagnosis not present

## 2018-12-12 DIAGNOSIS — K50919 Crohn's disease, unspecified, with unspecified complications: Secondary | ICD-10-CM | POA: Diagnosis not present

## 2018-12-12 NOTE — Progress Notes (Signed)
Chief Complaint:  Anthony Reed is a 33 y.o. male who presents today for his annual comprehensive physical exam.    Assessment/Plan:  Wolff-Parkinson-White (WPW) pattern Stable. Discussed reasons to seek medical care.  Crohn's disease with complication ([place]) Stable. Continue management per GI.    Body mass index is 29.45 kg/m. / Overweight BMI Metric Follow Up - 12/12/18 0950      BMI Metric Follow Up-Please document annually   BMI Metric Follow Up  Education provided        Preventative Healthcare: Due for flu vaccine later this fall.   Patient Counseling(The following topics were reviewed and/or handout was given):  -Nutrition: Stressed importance of moderation in sodium/caffeine intake, saturated fat and cholesterol, caloric balance, sufficient intake of fresh fruits, vegetables, and fiber.  -Stressed the importance of regular exercise.   -Substance Abuse: Discussed cessation/primary prevention of tobacco, alcohol, or other drug use; driving or other dangerous activities under the influence; availability of treatment for abuse.   -Injury prevention: Discussed safety belts, safety helmets, smoke detector, smoking near bedding or upholstery.   -Sexuality: Discussed sexually transmitted diseases, partner selection, use of condoms, avoidance of unintended pregnancy and contraceptive alternatives.   -Dental health: Discussed importance of regular tooth brushing, flossing, and dental visits.  -Health maintenance and immunizations reviewed. Please refer to Health maintenance section.  Return to care in 1 year for next preventative visit.     Subjective:  HPI:  He has no acute complaints today.   His stable, chronic medical conditions are outlined below:   % Crohn Disease  - Follows with GI at [place] and tolerating well  # Seasonal Allergies - Takes claritin as needed seasonally   Lifestyle Diet: Tries to eat balance diet. Limited in fruits and vegetables  due Crohn Exercise: Exercises regularly at home. Likes to ride bikes.   Depression screen PHQ 2/9 12/12/2018  Decreased Interest 0  Down, Depressed, Hopeless 0  PHQ - 2 Score 0    Health Maintenance Due  Topic Date Due  . HIV Screening  08/30/1999  . INFLUENZA VACCINE  12/09/2018     ROS: Per HPI, otherwise a complete review of systems was negative.   PMH:  The following were reviewed and entered/updated in epic: Past Medical History:  Diagnosis Date  . Allergy   . Crohn disease ([place])   . Crohn disease ([place])   . GERD (gastroesophageal reflux disease)   . Wolff-Parkinson-White (WPW) pattern    Patient Active Problem List   Diagnosis Date Noted  . Crohn's disease with complication ([place]) 32/20/2542  . Wolff-Parkinson-White (WPW) pattern    Past Surgical History:  Procedure Laterality Date  . BOWEL RESECTION  2016   Small bowel    Family History  Problem Relation Age of Onset  . Thyroid disease Mother   . Asthma Father   . Hyperlipidemia Father   . Hypertension Father   . Early death Maternal Grandfather   . Cancer Maternal Grandfather 6  . COPD Paternal Grandmother   . Heart disease Paternal Grandfather   . Hyperlipidemia Paternal Grandfather   . Hypertension Paternal Grandfather   . Kidney disease Paternal Grandfather   . Asthma Sister   . Colon cancer Neg Hx   . Prostate cancer Neg Hx     Medications- reviewed and updated Current Outpatient Medications  Medication Sig Dispense Refill  . Adalimumab (HUMIRA) 40 MG/0.8ML PSKT Inject 40 mg into the skin every 14 (fourteen) days.    Marland Kitchen  Ascorbic Acid (VITAMIN C PO) Take 1 tablet by mouth daily.    Marland Kitchen loratadine (CLARITIN) 10 MG tablet Take by mouth.    . Methylcobalamin 1 MG CHEW Chew by mouth.    . Multiple Vitamin (THERA) TABS Take by mouth.    . Omega-3 Fatty Acids (FISH OIL PO) Take 1 capsule by mouth daily.    . Probiotic Product (PROBIOTIC PO) Take 1 tablet by mouth daily.    Marland Kitchen VITAMIN D,  CHOLECALCIFEROL, PO Take by mouth.     No current facility-administered medications for this visit.     Allergies-reviewed and updated No Known Allergies  Social History   Socioeconomic History  . Marital status: Married    Spouse name: Not on file  . Number of children: Not on file  . Years of education: Not on file  . Highest education level: Not on file  Occupational History  . Not on file  Social Needs  . Financial resource strain: Not on file  . Food insecurity    Worry: Not on file    Inability: Not on file  . Transportation needs    Medical: Not on file    Non-medical: Not on file  Tobacco Use  . Smoking status: Former Smoker    Quit date: 12/11/2008    Years since quitting: 10.0  . Smokeless tobacco: Never Used  Substance and Sexual Activity  . Alcohol use: Yes    Alcohol/week: 0.0 standard drinks  . Drug use: Never  . Sexual activity: Yes  Lifestyle  . Physical activity    Days per week: Not on file    Minutes per session: Not on file  . Stress: Not on file  Relationships  . Social Herbalist on phone: Not on file    Gets together: Not on file    Attends religious service: Not on file    Active member of club or organization: Not on file    Attends meetings of clubs or organizations: Not on file    Relationship status: Not on file  Other Topics Concern  . Not on file  Social History Narrative  . Not on file        Objective:  Physical Exam: BP 128/88 (BP Location: Left Arm, Patient Position: Sitting, Cuff Size: Normal)   Pulse (!) 59   Temp 97.7 F (36.5 C) (Oral)   Ht 5' 10.5" (1.791 m)   Wt 208 lb 3.2 oz (94.4 kg)   SpO2 99%   BMI 29.45 kg/m   Body mass index is 29.45 kg/m. Wt Readings from Last 3 Encounters:  12/12/18 208 lb 3.2 oz (94.4 kg)  09/16/15 190 lb (86.2 kg)  05/16/14 199 lb (90.3 kg)   Gen: NAD, resting comfortably HEENT: TMs normal bilaterally. OP clear. No thyromegaly noted.  CV: RRR with no murmurs  appreciated Pulm: NWOB, CTAB with no crackles, wheezes, or rhonchi GI: Normal bowel sounds present. Soft, Nontender, Nondistended. MSK: no edema, cyanosis, or clubbing noted Skin: warm, dry Neuro: CN2-12 grossly intact. Strength 5/5 in upper and lower extremities. Reflexes symmetric and intact bilaterally.  Psych: Normal affect and thought content     Kieu Quiggle M. [name], MD 12/12/2018 9:51 AM

## 2018-12-12 NOTE — Patient Instructions (Signed)
It was very nice to see you today!  Keep up the good work!  Come back to see me in 1 year for your next physical or sooner as needed.   Take care, Dr [name]  Please try these tips to maintain a healthy lifestyle:   Eat at least 3 REAL meals and 1-2 snacks per day.  Aim for no more than 5 hours between eating.  If you eat breakfast, please do so within one hour of getting up.    Obtain twice as many fruits/vegetables as protein or carbohydrate foods for both lunch and dinner. (Half of each meal should be fruits/vegetables, one quarter protein, and one quarter starchy carbs)   Cut down on sweet beverages. This includes juice, soda, and sweet tea.    Exercise at least 150 minutes every week.    Preventive Care 19-59 Years Old, Male Preventive care refers to lifestyle choices and visits with your health care provider that can promote health and wellness. This includes:  A yearly physical exam. This is also called an annual well check.  Regular dental and eye exams.  Immunizations.  Screening for certain conditions.  Healthy lifestyle choices, such as eating a healthy diet, getting regular exercise, not using drugs or products that contain nicotine and tobacco, and limiting alcohol use. What can I expect for my preventive care visit? Physical exam Your health care provider will check:  Height and weight. These may be used to calculate body mass index (BMI), which is a measurement that tells if you are at a healthy weight.  Heart rate and blood pressure.  Your skin for abnormal spots. Counseling Your health care provider may ask you questions about:  Alcohol, tobacco, and drug use.  Emotional well-being.  Home and relationship well-being.  Sexual activity.  Eating habits.  Work and work Statistician. What immunizations do I need?  Influenza (flu) vaccine  This is recommended every year. Tetanus, diphtheria, and pertussis (Tdap) vaccine  You may need a Td  booster every 10 years. Varicella (chickenpox) vaccine  You may need this vaccine if you have not already been vaccinated. Human papillomavirus (HPV) vaccine  If recommended by your health care provider, you may need three doses over 6 months. Measles, mumps, and rubella (MMR) vaccine  You may need at least one dose of MMR. You may also need a second dose. Meningococcal conjugate (MenACWY) vaccine  One dose is recommended if you are 81-78 years old and a Market researcher living in a residence hall, or if you have one of several medical conditions. You may also need additional booster doses. Pneumococcal conjugate (PCV13) vaccine  You may need this if you have certain conditions and were not previously vaccinated. Pneumococcal polysaccharide (PPSV23) vaccine  You may need one or two doses if you smoke cigarettes or if you have certain conditions. Hepatitis A vaccine  You may need this if you have certain conditions or if you travel or work in places where you may be exposed to hepatitis A. Hepatitis B vaccine  You may need this if you have certain conditions or if you travel or work in places where you may be exposed to hepatitis B. Haemophilus influenzae type b (Hib) vaccine  You may need this if you have certain risk factors. You may receive vaccines as individual doses or as more than one vaccine together in one shot (combination vaccines). Talk with your health care provider about the risks and benefits of combination vaccines. What tests do I  need? Blood tests  Lipid and cholesterol levels. These may be checked every 5 years starting at age 75.  Hepatitis C test.  Hepatitis B test. Screening   Diabetes screening. This is done by checking your blood sugar (glucose) after you have not eaten for a while (fasting).  Sexually transmitted disease (STD) testing. Talk with your health care provider about your test results, treatment options, and if necessary, the need  for more tests. Follow these instructions at home: Eating and drinking   Eat a diet that includes fresh fruits and vegetables, whole grains, lean protein, and low-fat dairy products.  Take vitamin and mineral supplements as recommended by your health care provider.  Do not drink alcohol if your health care provider tells you not to drink.  If you drink alcohol: ? Limit how much you have to 0-2 drinks a day. ? Be aware of how much alcohol is in your drink. In the [place] one drink equals one 12 oz bottle of beer (355 mL), one 5 oz glass of wine (148 mL), or one 1 oz glass of hard liquor (44 mL). Lifestyle  Take daily care of your teeth and gums.  Stay active. Exercise for at least 30 minutes on 5 or more days each week.  Do not use any products that contain nicotine or tobacco, such as cigarettes, e-cigarettes, and chewing tobacco. If you need help quitting, ask your health care provider.  If you are sexually active, practice safe sex. Use a condom or other form of protection to prevent STIs (sexually transmitted infections). What's next?  Go to your health care provider once a year for a well check visit.  Ask your health care provider how often you should have your eyes and teeth checked.  Stay up to date on all vaccines. This information is not intended to replace advice given to you by your health care provider. Make sure you discuss any questions you have with your health care provider. Document Released: 06/22/2001 Document Revised: 04/20/2018 Document Reviewed: 04/20/2018 Elsevier Patient Education  2020 [employer].

## 2018-12-12 NOTE — Assessment & Plan Note (Signed)
Stable. Continue management per GI.

## 2018-12-12 NOTE — Assessment & Plan Note (Signed)
Stable. Discussed reasons to seek medical care.

## 2018-12-22 ENCOUNTER — Encounter: Payer: Self-pay | Admitting: Family Medicine

## 2019-04-13 ENCOUNTER — Other Ambulatory Visit: Payer: Self-pay

## 2019-04-13 DIAGNOSIS — Z20822 Contact with and (suspected) exposure to covid-19: Secondary | ICD-10-CM

## 2019-04-17 LAB — NOVEL CORONAVIRUS, NAA: SARS-CoV-2, NAA: NOT DETECTED

## 2019-04-18 ENCOUNTER — Other Ambulatory Visit: Payer: Self-pay

## 2019-04-18 DIAGNOSIS — Z20822 Contact with and (suspected) exposure to covid-19: Secondary | ICD-10-CM

## 2019-04-20 LAB — NOVEL CORONAVIRUS, NAA: SARS-CoV-2, NAA: NOT DETECTED

## 2019-05-03 DIAGNOSIS — Z20828 Contact with and (suspected) exposure to other viral communicable diseases: Secondary | ICD-10-CM | POA: Diagnosis not present

## 2019-07-27 ENCOUNTER — Ambulatory Visit: Payer: BC Managed Care – PPO | Attending: Internal Medicine

## 2019-07-27 DIAGNOSIS — Z23 Encounter for immunization: Secondary | ICD-10-CM

## 2019-07-27 NOTE — Progress Notes (Signed)
   Covid-19 Vaccination Clinic  Name:  Anthony Reed    MRN: [id number] DOB: 11/08/1985  07/27/2019  [name] was observed post Covid-19 immunization for 15 minutes without incident. He was provided with Vaccine Information Sheet and instruction to access the V-Safe system.   [name] was instructed to call 911 with any severe reactions post vaccine: Marland Kitchen Difficulty breathing  . Swelling of face and throat  . A fast heartbeat  . A bad rash all over body  . Dizziness and weakness   Immunizations Administered    Name Date Dose VIS Date Route   Pfizer COVID-19 Vaccine 07/27/2019  8:51 AM 0.3 mL 04/20/2019 Intramuscular   Manufacturer: [place]   Lot: [id number]   [place]: [id number]

## 2019-08-22 ENCOUNTER — Ambulatory Visit: Payer: BC Managed Care – PPO | Attending: Internal Medicine

## 2019-08-22 DIAGNOSIS — Z23 Encounter for immunization: Secondary | ICD-10-CM

## 2019-08-22 NOTE — Progress Notes (Signed)
   Covid-19 Vaccination Clinic  Name:  Anthony Reed    MRN: [id number] DOB: 19-Feb-1986  08/22/2019  [name] was observed post Covid-19 immunization for 15 minutes without incident. He was provided with Vaccine Information Sheet and instruction to access the V-Safe system.   [name] was instructed to call 911 with any severe reactions post vaccine: Marland Kitchen Difficulty breathing  . Swelling of face and throat  . A fast heartbeat  . A bad rash all over body  . Dizziness and weakness   Immunizations Administered    Name Date Dose VIS Date Route   Pfizer COVID-19 Vaccine 08/22/2019  3:42 PM 0.3 mL 04/20/2019 Intramuscular   Manufacturer: [place]   Lot: [id number]   [place]: [id number]

## 2019-11-01 DIAGNOSIS — Z20822 Contact with and (suspected) exposure to covid-19: Secondary | ICD-10-CM | POA: Diagnosis not present

## 2019-11-01 DIAGNOSIS — J029 Acute pharyngitis, unspecified: Secondary | ICD-10-CM | POA: Diagnosis not present

## 2019-11-01 DIAGNOSIS — Z1152 Encounter for screening for COVID-19: Secondary | ICD-10-CM | POA: Diagnosis not present

## 2019-11-01 DIAGNOSIS — J069 Acute upper respiratory infection, unspecified: Secondary | ICD-10-CM | POA: Diagnosis not present

## 2019-12-14 ENCOUNTER — Encounter: Payer: BLUE CROSS/BLUE SHIELD | Admitting: Family Medicine

## 2019-12-21 ENCOUNTER — Ambulatory Visit (INDEPENDENT_AMBULATORY_CARE_PROVIDER_SITE_OTHER): Payer: BC Managed Care – PPO | Admitting: Family Medicine

## 2019-12-21 ENCOUNTER — Other Ambulatory Visit: Payer: Self-pay

## 2019-12-21 ENCOUNTER — Encounter: Payer: Self-pay | Admitting: Family Medicine

## 2019-12-21 VITALS — BP 131/88 | HR 56 | Temp 98.2°F | Ht 70.5 in | Wt 203.2 lb

## 2019-12-21 DIAGNOSIS — K50919 Crohn's disease, unspecified, with unspecified complications: Secondary | ICD-10-CM | POA: Diagnosis not present

## 2019-12-21 DIAGNOSIS — Z13220 Encounter for screening for lipoid disorders: Secondary | ICD-10-CM

## 2019-12-21 DIAGNOSIS — Z0001 Encounter for general adult medical examination with abnormal findings: Secondary | ICD-10-CM | POA: Diagnosis not present

## 2019-12-21 DIAGNOSIS — D529 Folate deficiency anemia, unspecified: Secondary | ICD-10-CM | POA: Diagnosis not present

## 2019-12-21 NOTE — Assessment & Plan Note (Signed)
Stable.  Continue Humira per GI.  Will check labs including CBC, CMET, TSH, lipid panel, iron, B12, folate, CRP, and sed rate.

## 2019-12-21 NOTE — Patient Instructions (Addendum)
It was very nice to see you today!  Please give Anthony Reed call to talk about her stress levels.  Please work on exercises for her shoulder.  You can use on your labs.  Also try using the Voltaren gel.  We will check blood work today.  Come back to see me in 1 year.  Please come back to me sooner if needed.   Take care, Dr [name]  Please try these tips to maintain a healthy lifestyle:   Eat at least 3 REAL meals and 1-2 snacks per day.  Aim for no more than 5 hours between eating.  If you eat breakfast, please do so within one hour of getting up.    Each meal should contain half fruits/vegetables, one quarter protein, and one quarter carbs (no bigger than a computer mouse)   Cut down on sweet beverages. This includes juice, soda, and sweet tea.     Drink at least 1 glass of water with each meal and aim for at least 8 glasses per day   Exercise at least 150 minutes every week.    Preventive Care 62-35 Years Old, Male Preventive care refers to lifestyle choices and visits with your health care provider that can promote health and wellness. This includes:  A yearly physical exam. This is also called an annual well check.  Regular dental and eye exams.  Immunizations.  Screening for certain conditions.  Healthy lifestyle choices, such as eating a healthy diet, getting regular exercise, not using drugs or products that contain nicotine and tobacco, and limiting alcohol use. What can I expect for my preventive care visit? Physical exam Your health care provider will check:  Height and weight. These may be used to calculate body mass index (BMI), which is a measurement that tells if you are at a healthy weight.  Heart rate and blood pressure.  Your skin for abnormal spots. Counseling Your health care provider may ask you questions about:  Alcohol, tobacco, and drug use.  Emotional well-being.  Home and relationship well-being.  Sexual activity.  Eating  habits.  Work and work Statistician. What immunizations do I need?  Influenza (flu) vaccine  This is recommended every year. Tetanus, diphtheria, and pertussis (Tdap) vaccine  You may need a Td booster every 10 years. Varicella (chickenpox) vaccine  You may need this vaccine if you have not already been vaccinated. Human papillomavirus (HPV) vaccine  If recommended by your health care provider, you may need three doses over 6 months. Measles, mumps, and rubella (MMR) vaccine  You may need at least one dose of MMR. You may also need a second dose. Meningococcal conjugate (MenACWY) vaccine  One dose is recommended if you are 58-91 years old and a Market researcher living in a residence hall, or if you have one of several medical conditions. You may also need additional booster doses. Pneumococcal conjugate (PCV13) vaccine  You may need this if you have certain conditions and were not previously vaccinated. Pneumococcal polysaccharide (PPSV23) vaccine  You may need one or two doses if you smoke cigarettes or if you have certain conditions. Hepatitis A vaccine  You may need this if you have certain conditions or if you travel or work in places where you may be exposed to hepatitis A. Hepatitis B vaccine  You may need this if you have certain conditions or if you travel or work in places where you may be exposed to hepatitis B. Haemophilus influenzae type b (Hib) vaccine  You  may need this if you have certain risk factors. You may receive vaccines as individual doses or as more than one vaccine together in one shot (combination vaccines). Talk with your health care provider about the risks and benefits of combination vaccines. What tests do I need? Blood tests  Lipid and cholesterol levels. These may be checked every 5 years starting at age 38.  Hepatitis C test.  Hepatitis B test. Screening   Diabetes screening. This is done by checking your blood sugar (glucose)  after you have not eaten for a while (fasting).  Sexually transmitted disease (STD) testing. Talk with your health care provider about your test results, treatment options, and if necessary, the need for more tests. Follow these instructions at home: Eating and drinking   Eat a diet that includes fresh fruits and vegetables, whole grains, lean protein, and low-fat dairy products.  Take vitamin and mineral supplements as recommended by your health care provider.  Do not drink alcohol if your health care provider tells you not to drink.  If you drink alcohol: ? Limit how much you have to 0-2 drinks a day. ? Be aware of how much alcohol is in your drink. In the [place] one drink equals one 12 oz bottle of beer (355 mL), one 5 oz glass of wine (148 mL), or one 1 oz glass of hard liquor (44 mL). Lifestyle  Take daily care of your teeth and gums.  Stay active. Exercise for at least 30 minutes on 5 or more days each week.  Do not use any products that contain nicotine or tobacco, such as cigarettes, e-cigarettes, and chewing tobacco. If you need help quitting, ask your health care provider.  If you are sexually active, practice safe sex. Use a condom or other form of protection to prevent STIs (sexually transmitted infections). What's next?  Go to your health care provider once a year for a well check visit.  Ask your health care provider how often you should have your eyes and teeth checked.  Stay up to date on all vaccines. This information is not intended to replace advice given to you by your health care provider. Make sure you discuss any questions you have with your health care provider. Document Revised: 04/20/2018 Document Reviewed: 04/20/2018 Elsevier Patient Education  2020 [employer].

## 2019-12-21 NOTE — Progress Notes (Signed)
Chief Complaint:  Anthony Reed is a 34 y.o. male who presents today for his annual comprehensive physical exam.    Assessment/Plan:  New/Acute Problems: Stress Currently manageable.  Gave behavioral health handout pamphlet.  Shoulder Pain Consistent with rotator cuff tendinopathy.  Not a candidate for NSAIDs due to Crohn's disease.  Start home exercises.  Can also try over-the-counter Motrin.  Chronic Problems Addressed Today: Crohn's disease with complication ([place]) Stable.  Continue Humira per GI.  Will check labs including CBC, CMET, TSH, lipid panel, iron, B12, folate, CRP, and sed rate.   Body mass index is 28.74 kg/m. / Obese    Preventative Healthcare: Check CBC, CMET, TSH, lipid panel.  Patient Counseling(The following topics were reviewed and/or handout was given):  -Nutrition: Stressed importance of moderation in sodium/caffeine intake, saturated fat and cholesterol, caloric balance, sufficient intake of fresh fruits, vegetables, and fiber.  -Stressed the importance of regular exercise.   -Substance Abuse: Discussed cessation/primary prevention of tobacco, alcohol, or other drug use; driving or other dangerous activities under the influence; availability of treatment for abuse.   -Injury prevention: Discussed safety belts, safety helmets, smoke detector, smoking near bedding or upholstery.   -Sexuality: Discussed sexually transmitted diseases, partner selection, use of condoms, avoidance of unintended pregnancy and contraceptive alternatives.   -Dental health: Discussed importance of regular tooth brushing, flossing, and dental visits.  -Health maintenance and immunizations reviewed. Please refer to Health maintenance section.  Return to care in 1 year for next preventative visit.     Subjective:  HPI:  He has been under quite a bit of stress recently.  Mostly due to managing his chronic illness.  He is interested in seeing a counselor or therapist.  He is also  has some issues with left shoulder pain for the past few weeks.  No obvious injuries or precipitating events.  Worse with certain motions.  He will be following up with GI in the next few weeks for his Crohn's disease.  Symptoms are overall stable when Humira.  Lifestyle Diet: Limited due to Crohn. Exercise: Runs weekly.   Depression screen PHQ 2/9 12/12/2018  Decreased Interest 0  Down, Depressed, Hopeless 0  PHQ - 2 Score 0    Health Maintenance Due  Topic Date Due  . Hepatitis C Screening  Never done  . HIV Screening  Never done  . INFLUENZA VACCINE  12/09/2019     ROS: Per HPI, otherwise a complete review of systems was negative.   PMH:  The following were reviewed and entered/updated in epic: Past Medical History:  Diagnosis Date  . Allergy   . Crohn disease ([place])   . Crohn disease ([place])   . GERD (gastroesophageal reflux disease)   . Wolff-Parkinson-White (WPW) pattern    Patient Active Problem List   Diagnosis Date Noted  . Crohn's disease with complication ([place]) 29/92/4268  . Wolff-Parkinson-White (WPW) pattern    Past Surgical History:  Procedure Laterality Date  . BOWEL RESECTION  2016   Small bowel    Family History  Problem Relation Age of Onset  . Thyroid disease Mother   . Asthma Father   . Hyperlipidemia Father   . Hypertension Father   . Early death Maternal Grandfather   . Cancer Maternal Grandfather 59  . COPD Paternal Grandmother   . Heart disease Paternal Grandfather   . Hyperlipidemia Paternal Grandfather   . Hypertension Paternal Grandfather   . Kidney disease Paternal Grandfather   . Asthma Sister   .  Colon cancer Neg Hx   . Prostate cancer Neg Hx     Medications- reviewed and updated Current Outpatient Medications  Medication Sig Dispense Refill  . Adalimumab (HUMIRA) 40 MG/0.8ML PSKT Inject 40 mg into the skin every 14 (fourteen) days.    . Ascorbic Acid (VITAMIN C PO) Take 1 tablet by mouth daily.    . ferrous sulfate 325 (65  FE) MG tablet Take 325 mg by mouth daily with breakfast.    . loratadine (CLARITIN) 10 MG tablet Take by mouth.    . Multiple Vitamin (THERA) TABS Take by mouth.    . Omega-3 Fatty Acids (FISH OIL PO) Take 1 capsule by mouth daily.    . Probiotic Product (PROBIOTIC PO) Take 1 tablet by mouth daily.    Marland Kitchen VITAMIN D, CHOLECALCIFEROL, PO Take by mouth.     No current facility-administered medications for this visit.    Allergies-reviewed and updated No Known Allergies  Social History   Socioeconomic History  . Marital status: Married    Spouse name: Not on file  . Number of children: Not on file  . Years of education: Not on file  . Highest education level: Not on file  Occupational History  . Not on file  Tobacco Use  . Smoking status: Former Smoker    Quit date: 12/11/2008    Years since quitting: 11.0  . Smokeless tobacco: Never Used  Vaping Use  . Vaping Use: Never used  Substance and Sexual Activity  . Alcohol use: Yes    Alcohol/week: 0.0 standard drinks  . Drug use: Never  . Sexual activity: Yes  Other Topics Concern  . Not on file  Social History Narrative  . Not on file   Social Determinants of Health   Financial Resource Strain:   . Difficulty of Paying Living Expenses:   Food Insecurity:   . Worried About Charity fundraiser in the Last Year:   . Arboriculturist in the Last Year:   Transportation Needs:   . Film/video editor (Medical):   Marland Kitchen Lack of Transportation (Non-Medical):   Physical Activity:   . Days of Exercise per Week:   . Minutes of Exercise per Session:   Stress:   . Feeling of Stress :   Social Connections:   . Frequency of Communication with Friends and Family:   . Frequency of Social Gatherings with Friends and Family:   . Attends Religious Services:   . Active Member of Clubs or Organizations:   . Attends Archivist Meetings:   Marland Kitchen Marital Status:         Objective:  Physical Exam: BP 131/88   Pulse (!) 56   Temp  98.2 F (36.8 C)   Ht 5' 10.5" (1.791 m)   Wt 203 lb 3.2 oz (92.2 kg)   SpO2 99%   BMI 28.74 kg/m   Body mass index is 28.74 kg/m. Wt Readings from Last 3 Encounters:  12/21/19 203 lb 3.2 oz (92.2 kg)  12/12/18 208 lb 3.2 oz (94.4 kg)  09/16/15 190 lb (86.2 kg)   Gen: NAD, resting comfortably HEENT: TMs normal bilaterally. OP clear. No thyromegaly noted.  CV: RRR with no murmurs appreciated Pulm: NWOB, CTAB with no crackles, wheezes, or rhonchi GI: Normal bowel sounds present. Soft, Nontender, Nondistended. MSK: no edema, cyanosis, or clubbing noted.  Tenderness to posterior left shoulder.  Pain elicited with resisted supraspinatus and external rotation. Skin: warm, dry Neuro: CN2-12 grossly  intact. Strength 5/5 in upper and lower extremities. Reflexes symmetric and intact bilaterally.  Psych: Normal affect and thought content     Alaa Mullally M. [name], MD 12/21/2019 3:18 PM

## 2019-12-22 LAB — FERRITIN: Ferritin: 39 ng/mL (ref 38–380)

## 2019-12-22 LAB — COMPREHENSIVE METABOLIC PANEL
AG Ratio: 1.9 (calc) (ref 1.0–2.5)
ALT: 26 U/L (ref 9–46)
AST: 23 U/L (ref 10–40)
Albumin: 4.4 g/dL (ref 3.6–5.1)
Alkaline phosphatase (APISO): 55 U/L (ref 36–130)
BUN: 18 mg/dL (ref 7–25)
CO2: 27 mmol/L (ref 20–32)
Calcium: 9.8 mg/dL (ref 8.6–10.3)
Chloride: 104 mmol/L (ref 98–110)
Creat: 1.03 mg/dL (ref 0.60–1.35)
Globulin: 2.3 g/dL (calc) (ref 1.9–3.7)
Glucose, Bld: 82 mg/dL (ref 65–99)
Potassium: 4.6 mmol/L (ref 3.5–5.3)
Sodium: 139 mmol/L (ref 135–146)
Total Bilirubin: 1.6 mg/dL — ABNORMAL HIGH (ref 0.2–1.2)
Total Protein: 6.7 g/dL (ref 6.1–8.1)

## 2019-12-22 LAB — CBC WITH DIFFERENTIAL/PLATELET
Absolute Monocytes: 854 cells/uL (ref 200–950)
Basophils Absolute: 58 cells/uL (ref 0–200)
Basophils Relative: 0.6 %
Eosinophils Absolute: 223 cells/uL (ref 15–500)
Eosinophils Relative: 2.3 %
HCT: 48.1 % (ref 38.5–50.0)
Hemoglobin: 16.2 g/dL (ref 13.2–17.1)
Lymphs Abs: 2959 cells/uL (ref 850–3900)
MCH: 29.5 pg (ref 27.0–33.0)
MCHC: 33.7 g/dL (ref 32.0–36.0)
MCV: 87.5 fL (ref 80.0–100.0)
MPV: 10.3 fL (ref 7.5–12.5)
Monocytes Relative: 8.8 %
Neutro Abs: 5607 cells/uL (ref 1500–7800)
Neutrophils Relative %: 57.8 %
Platelets: 281 10*3/uL (ref 140–400)
RBC: 5.5 10*6/uL (ref 4.20–5.80)
RDW: 13.1 % (ref 11.0–15.0)
Total Lymphocyte: 30.5 %
WBC: 9.7 10*3/uL (ref 3.8–10.8)

## 2019-12-22 LAB — IRON, TOTAL/TOTAL IRON BINDING CAP
%SAT: 26 % (calc) (ref 20–48)
Iron: 95 ug/dL (ref 50–180)
TIBC: 372 mcg/dL (calc) (ref 250–425)

## 2019-12-22 LAB — TSH: TSH: 2.74 mIU/L (ref 0.40–4.50)

## 2019-12-22 LAB — VITAMIN B12: Vitamin B-12: 986 pg/mL (ref 200–1100)

## 2019-12-22 LAB — LIPID PANEL
Cholesterol: 180 mg/dL (ref <200)
HDL: 67 mg/dL (ref >=40)
LDL Cholesterol (Calc): 96 mg/dL (calc)
Non-HDL Cholesterol (Calc): 113 mg/dL (calc) (ref <130)
Total CHOL/HDL Ratio: 2.7 (calc) (ref <5.0)
Triglycerides: 83 mg/dL (ref <150)

## 2019-12-22 LAB — C-REACTIVE PROTEIN: CRP: 0.7 mg/L (ref <8.0)

## 2019-12-22 LAB — SEDIMENTATION RATE: Sed Rate: 2 mm/h (ref 0–15)

## 2019-12-22 LAB — FOLATE: Folate: 16.1 ng/mL

## 2019-12-24 NOTE — Progress Notes (Signed)
Please inform patient of the following:  Labs are all STABLE. Would like for him to keep up the good work and we can recheck in a year.  Anthony Reed. [name], MD 12/24/2019 3:16 PM

## 2019-12-26 DIAGNOSIS — R197 Diarrhea, unspecified: Secondary | ICD-10-CM | POA: Diagnosis not present

## 2019-12-26 DIAGNOSIS — D509 Iron deficiency anemia, unspecified: Secondary | ICD-10-CM | POA: Diagnosis not present

## 2019-12-26 DIAGNOSIS — K921 Melena: Secondary | ICD-10-CM | POA: Diagnosis not present

## 2019-12-26 DIAGNOSIS — K50812 Crohn's disease of both small and large intestine with intestinal obstruction: Secondary | ICD-10-CM | POA: Diagnosis not present

## 2020-01-04 DIAGNOSIS — K50019 Crohn's disease of small intestine with unspecified complications: Secondary | ICD-10-CM | POA: Diagnosis not present

## 2020-01-11 ENCOUNTER — Encounter: Payer: Self-pay | Admitting: Family Medicine

## 2020-01-11 ENCOUNTER — Other Ambulatory Visit: Payer: Self-pay

## 2020-01-11 ENCOUNTER — Ambulatory Visit (INDEPENDENT_AMBULATORY_CARE_PROVIDER_SITE_OTHER): Payer: BC Managed Care – PPO | Admitting: Family Medicine

## 2020-01-11 VITALS — BP 140/88 | HR 65 | Temp 98.3°F | Ht 70.5 in | Wt 209.0 lb

## 2020-01-11 DIAGNOSIS — Z23 Encounter for immunization: Secondary | ICD-10-CM

## 2020-01-11 DIAGNOSIS — L600 Ingrowing nail: Secondary | ICD-10-CM

## 2020-01-11 MED ORDER — CEPHALEXIN 500 MG PO CAPS
500.0000 mg | ORAL_CAPSULE | Freq: Two times a day (BID) | ORAL | 0 refills | Status: DC
Start: 2020-01-11 — End: 2020-12-22

## 2020-01-11 NOTE — Progress Notes (Signed)
Subjective  CC:  Chief Complaint  Patient presents with  . Nail Problem    possible ingrown lt toe nail    Same day acute visit; PCP not available. New pt to me. Chart reviewed.   HPI: Anthony Reed is a 34 y.o. male who presents to the office today to address the problems listed above in the chief complaint.  34 yo with crohn's disease that is well controlled c/o 3-4 day of left great ingrown toenail with infection. Reports it was red and swollen and very painful: with nail cutting and daily peroxide, it is now much improved. Has had long history of ingrown toenails and typically can manage with conservative nail care/cutting. Has had wedge resection in past but not since he was a teenager. No fevers.   Assessment  1. Ingrowing toenail with infection   2. Need for immunization against influenza      Plan   Ingrown toenail with infection:  Improving. Will hold off on wedge resection, hopeful that can be avoided if treat infection and let nail grow. Return to office in 2-4 weeks if tenderness persists in spite of treating infection. Patient understands and agrees with care plan.    Follow up: prn  Visit date not found  Orders Placed This Encounter  Procedures  . Flu Vaccine QUAD 36+ mos IM   Meds ordered this encounter  Medications  . cephALEXin (KEFLEX) 500 MG capsule    Sig: Take 1 capsule (500 mg total) by mouth 2 (two) times daily.    Dispense:  14 capsule    Refill:  0      I reviewed the patients updated PMH, FH, and SocHx.    Patient Active Problem List   Diagnosis Date Noted  . Crohn's disease with complication ([place]) 01/65/5374  . Wolff-Parkinson-White (WPW) pattern    Current Meds  Medication Sig  . Adalimumab (HUMIRA) 40 MG/0.8ML PSKT Inject 40 mg into the skin every 14 (fourteen) days.  . Ascorbic Acid (VITAMIN C PO) Take 1 tablet by mouth daily.  . ferrous sulfate 325 (65 FE) MG tablet Take 325 mg by mouth daily with breakfast.  . loratadine (CLARITIN)  10 MG tablet Take by mouth.  . Multiple Vitamin (THERA) TABS Take by mouth.  . Omega-3 Fatty Acids (FISH OIL PO) Take 1 capsule by mouth daily.  . Probiotic Product (PROBIOTIC PO) Take 1 tablet by mouth daily.  Marland Kitchen VITAMIN D, CHOLECALCIFEROL, PO Take by mouth.    Allergies: Patient has No Known Allergies. Family History: Patient family history includes Asthma in his father and sister; COPD in his paternal grandmother; Cancer (age of onset: 44) in his maternal grandfather; Early death in his maternal grandfather; Heart disease in his paternal grandfather; Hyperlipidemia in his father and paternal grandfather; Hypertension in his father and paternal grandfather; Kidney disease in his paternal grandfather; Thyroid disease in his mother. Social History:  Patient  reports that he quit smoking about 11 years ago. He has never used smokeless tobacco. He reports current alcohol use. He reports that he does not use drugs.  Review of Systems: Constitutional: Negative for fever malaise or anorexia Cardiovascular: negative for chest pain Respiratory: negative for SOB or persistent cough Gastrointestinal: negative for abdominal pain  Objective  Vitals: BP 140/88   Pulse 65   Temp 98.3 F (36.8 C) (Temporal)   Ht 5' 10.5" (1.791 m)   Wt 209 lb (94.8 kg)   SpO2 98%   BMI 29.56 kg/m  General:  no acute distress , A&Ox3 Well appearing Left great toenail with medial ingrowing and ttp with mild ttp, w/o erythema or fluctuance Skin:  Warm, no rashes     Commons side effects, risks, benefits, and alternatives for medications and treatment plan prescribed today were discussed, and the patient expressed understanding of the given instructions. Patient is instructed to call or message via MyChart if he/she has any questions or concerns regarding our treatment plan. No barriers to understanding were identified. We discussed Red Flag symptoms and signs in detail. Patient expressed understanding regarding  what to do in case of urgent or emergency type symptoms.   Medication list was reconciled, printed and provided to the patient in AVS. Patient instructions and summary information was reviewed with the patient as documented in the AVS. This note was prepared with assistance of Dragon voice recognition software. Occasional wrong-word or sound-a-like substitutions may have occurred due to the inherent limitations of voice recognition software  This visit occurred during the SARS-CoV-2 public health emergency.  Safety protocols were in place, including screening questions prior to the visit, additional usage of staff PPE, and extensive cleaning of exam room while observing appropriate contact time as indicated for disinfecting solutions.

## 2020-01-11 NOTE — Patient Instructions (Signed)
Please follow up if symptoms do not improve or as needed.  Return if tenderness persists for toenail resection. Thanks!  Ingrown Toenail An ingrown toenail occurs when the corner or sides of a toenail grow into the surrounding skin. This causes discomfort and pain. The big toe is most commonly affected, but any of the toes can be affected. If an ingrown toenail is not treated, it can become infected. What are the causes? This condition may be caused by:  Wearing shoes that are too small or tight.  An injury, such as stubbing your toe or having your toe stepped on.  Improper cutting or care of your toenails.  Having nail or foot abnormalities that were present from birth (congenital abnormalities), such as having a nail that is too big for your toe. What increases the risk? The following factors may make you more likely to develop ingrown toenails:  Age. Nails tend to get thicker with age, so ingrown nails are more common among older people.  Cutting your toenails incorrectly, such as cutting them very short or cutting them unevenly. An ingrown toenail is more likely to get infected if you have:  Diabetes.  Blood flow (circulation) problems. What are the signs or symptoms? Symptoms of an ingrown toenail may include:  Pain, soreness, or tenderness.  Redness.  Swelling.  Hardening of the skin that surrounds the toenail. Signs that an ingrown toenail may be infected include:  Fluid or pus.  Symptoms that get worse instead of better. How is this diagnosed? An ingrown toenail may be diagnosed based on your medical history, your symptoms, and a physical exam. If you have fluid or blood coming from your toenail, a sample may be collected to test for the specific type of bacteria that is causing the infection. How is this treated? Treatment depends on how severe your ingrown toenail is. You may be able to care for your toenail at home.  If you have an infection, you may be  prescribed antibiotic medicines.  If you have fluid or pus draining from your toenail, your health care provider may drain it.  If you have trouble walking, you may be given crutches to use.  If you have a severe or infected ingrown toenail, you may need a procedure to remove part or all of the nail. Follow these instructions at home: Foot care   Do not pick at your toenail or try to remove it yourself.  Soak your foot in warm, soapy water. Do this for 20 minutes, 3 times a day, or as often as told by your health care provider. This helps to keep your toe clean and keep your skin soft.  Wear shoes that fit well and are not too tight. Your health care provider may recommend that you wear open-toed shoes while you heal.  Trim your toenails regularly and carefully. Cut your toenails straight across to prevent injury to the skin at the corners of the toenail. Do not cut your nails in a curved shape.  Keep your feet clean and dry to help prevent infection. Medicines  Take over-the-counter and prescription medicines only as told by your health care provider.  If you were prescribed an antibiotic, take it as told by your health care provider. Do not stop taking the antibiotic even if you start to feel better. Activity  Return to your normal activities as told by your health care provider. Ask your health care provider what activities are safe for you.  Avoid activities that cause  pain. General instructions  If your health care provider told you to use crutches to help you move around, use them as instructed.  Keep all follow-up visits as told by your health care provider. This is important. Contact a health care provider if:  You have more redness, swelling, pain, or other symptoms that do not improve with treatment.  You have fluid, blood, or pus coming from your toenail. Get help right away if:  You have a red streak on your skin that starts at your foot and spreads up your  leg.  You have a fever. Summary  An ingrown toenail occurs when the corner or sides of a toenail grow into the surrounding skin. This causes discomfort and pain. The big toe is most commonly affected, but any of the toes can be affected.  If an ingrown toenail is not treated, it can become infected.  Fluid or pus draining from your toenail is a sign of infection. Your health care provider may need to drain it. You may be given antibiotics to treat the infection.  Trimming your toenails regularly and properly can help you prevent an ingrown toenail. This information is not intended to replace advice given to you by your health care provider. Make sure you discuss any questions you have with your health care provider. Document Revised: 08/18/2018 Document Reviewed: 01/12/2017 Elsevier Patient Education  [place].

## 2020-01-17 DIAGNOSIS — K50019 Crohn's disease of small intestine with unspecified complications: Secondary | ICD-10-CM | POA: Diagnosis not present

## 2020-02-13 DIAGNOSIS — K633 Ulcer of intestine: Secondary | ICD-10-CM | POA: Diagnosis not present

## 2020-02-13 DIAGNOSIS — K5090 Crohn's disease, unspecified, without complications: Secondary | ICD-10-CM | POA: Diagnosis not present

## 2020-02-13 DIAGNOSIS — Z980 Intestinal bypass and anastomosis status: Secondary | ICD-10-CM | POA: Diagnosis not present

## 2020-02-13 DIAGNOSIS — Z1211 Encounter for screening for malignant neoplasm of colon: Secondary | ICD-10-CM | POA: Diagnosis not present

## 2020-02-13 LAB — HM COLONOSCOPY

## 2020-02-22 DIAGNOSIS — K5900 Constipation, unspecified: Secondary | ICD-10-CM | POA: Diagnosis not present

## 2020-02-22 DIAGNOSIS — K50812 Crohn's disease of both small and large intestine with intestinal obstruction: Secondary | ICD-10-CM | POA: Diagnosis not present

## 2020-02-22 DIAGNOSIS — R161 Splenomegaly, not elsewhere classified: Secondary | ICD-10-CM | POA: Diagnosis not present

## 2020-03-17 DIAGNOSIS — K50019 Crohn's disease of small intestine with unspecified complications: Secondary | ICD-10-CM | POA: Diagnosis not present

## 2020-03-26 ENCOUNTER — Encounter: Payer: Self-pay | Admitting: Family Medicine

## 2020-08-04 LAB — CBC AND DIFFERENTIAL
HCT: 50 (ref 41–53)
Hemoglobin: 17.2 (ref 13.5–17.5)
Platelets: 278 (ref 150–399)
WBC: 7.7

## 2020-08-04 LAB — CBC: RBC: 5.65 — AB (ref 3.87–5.11)

## 2020-11-17 LAB — BASIC METABOLIC PANEL
BUN: 9 (ref 4–21)
CO2: 27 — AB (ref 13–22)
Chloride: 105 (ref 99–108)
Creatinine: 1.2 (ref 0.6–1.3)
Glucose: 90
Potassium: 4.4 (ref 3.4–5.3)
Sodium: 140 (ref 137–147)

## 2020-11-17 LAB — CBC AND DIFFERENTIAL
HCT: 44 (ref 41–53)
Hemoglobin: 15.1 (ref 13.5–17.5)
Platelets: 253 (ref 150–399)
WBC: 6.4

## 2020-11-17 LAB — VITAMIN B12: Vitamin B-12: 512

## 2020-11-17 LAB — HEPATIC FUNCTION PANEL
ALT: 26 (ref 10–40)
AST: 25 (ref 14–40)
Alkaline Phosphatase: 48 (ref 25–125)

## 2020-11-17 LAB — VITAMIN D 25 HYDROXY (VIT D DEFICIENCY, FRACTURES): Vit D, 25-Hydroxy: 51

## 2020-12-22 ENCOUNTER — Ambulatory Visit (INDEPENDENT_AMBULATORY_CARE_PROVIDER_SITE_OTHER): Payer: 59 | Admitting: Family Medicine

## 2020-12-22 ENCOUNTER — Other Ambulatory Visit: Payer: Self-pay

## 2020-12-22 ENCOUNTER — Encounter: Payer: Self-pay | Admitting: Family Medicine

## 2020-12-22 VITALS — BP 123/82 | HR 65 | Temp 98.1°F | Ht 70.5 in | Wt 203.8 lb

## 2020-12-22 DIAGNOSIS — E663 Overweight: Secondary | ICD-10-CM

## 2020-12-22 DIAGNOSIS — K50919 Crohn's disease, unspecified, with unspecified complications: Secondary | ICD-10-CM | POA: Diagnosis not present

## 2020-12-22 DIAGNOSIS — G4730 Sleep apnea, unspecified: Secondary | ICD-10-CM | POA: Insufficient documentation

## 2020-12-22 DIAGNOSIS — Z0001 Encounter for general adult medical examination with abnormal findings: Secondary | ICD-10-CM | POA: Diagnosis not present

## 2020-12-22 DIAGNOSIS — Z23 Encounter for immunization: Secondary | ICD-10-CM

## 2020-12-22 DIAGNOSIS — Z6828 Body mass index (BMI) 28.0-28.9, adult: Secondary | ICD-10-CM | POA: Diagnosis not present

## 2020-12-22 NOTE — Progress Notes (Signed)
Chief Complaint:  Anthony Reed is a 35 y.o. male who presents today for his annual comprehensive physical exam.    Assessment/Plan:  Chronic Problems Addressed Today: Sleep-disordered breathing Will place referral for sleep study.  Crohn's disease with complication ([place]) Stable.  He is on Stelara per GI.  Body mass index is 28.83 kg/m. / Overweight   BMI Metric Follow Up - 12/22/20 1515       BMI Metric Follow Up-Please document annually   BMI Metric Follow Up Education provided             Preventative Healthcare: UTD on vaccines and screenings.   Patient Counseling(The following topics were reviewed and/or handout was given):  -Nutrition: Stressed importance of moderation in sodium/caffeine intake, saturated fat and cholesterol, caloric balance, sufficient intake of fresh fruits, vegetables, and fiber.  -Stressed the importance of regular exercise.   -Substance Abuse: Discussed cessation/primary prevention of tobacco, alcohol, or other drug use; driving or other dangerous activities under the influence; availability of treatment for abuse.   -Injury prevention: Discussed safety belts, safety helmets, smoke detector, smoking near bedding or upholstery.   -Sexuality: Discussed sexually transmitted diseases, partner selection, use of condoms, avoidance of unintended pregnancy and contraceptive alternatives.   -Dental health: Discussed importance of regular tooth brushing, flossing, and dental visits.  -Health maintenance and immunizations reviewed. Please refer to Health maintenance section.  Return to care in 1 year for next preventative visit.     Subjective:  HPI:  He expresses an interest in having a sleep study to try and get better sleep. He states that he has a FMHx of snoring. He states that he sometimes experiences fatigue, but not consistently, and not necessarily due to sleep issues.   Lifestyle Diet: Has a reasonably healthy diet, but admits he can  improve Exercise: Exercises multiple times a week   Depression screen PHQ 2/9 12/22/2020  Decreased Interest 0  Down, Depressed, Hopeless 0  PHQ - 2 Score 0    Health Maintenance Due  Topic Date Due   Pneumococcal Vaccine 31-56 Years old (1 - PCV) Never done   HIV Screening  Never done   Hepatitis C Screening  Never done   COVID-19 Vaccine (3 - Pfizer risk series) 09/19/2019   TETANUS/TDAP  11/07/2020   INFLUENZA VACCINE  12/08/2020     ROS: Per HPI, otherwise a complete review of systems was negative.   PMH:  The following were reviewed and entered/updated in epic: Past Medical History:  Diagnosis Date   Allergy    Crohn disease ([place])    Crohn disease ([place])    GERD (gastroesophageal reflux disease)    Wolff-Parkinson-White (WPW) pattern    Patient Active Problem List   Diagnosis Date Noted   Sleep-disordered breathing 12/22/2020   Crohn's disease with complication ([place]) 73/53/2992   Wolff-Parkinson-White (WPW) pattern    Past Surgical History:  Procedure Laterality Date   BOWEL RESECTION  2016   Small bowel    Family History  Problem Relation Age of Onset   Thyroid disease Mother    Asthma Father    Hyperlipidemia Father    Hypertension Father    Early death Maternal Grandfather    Cancer Maternal Grandfather 39   COPD Paternal Grandmother    Heart disease Paternal Grandfather    Hyperlipidemia Paternal Grandfather    Hypertension Paternal Grandfather    Kidney disease Paternal Grandfather    Asthma Sister    Colon cancer Neg Hx  Prostate cancer Neg Hx     Medications- reviewed and updated Current Outpatient Medications  Medication Sig Dispense Refill   Ascorbic Acid (VITAMIN C PO) Take 1 tablet by mouth daily.     ferrous sulfate 325 (65 FE) MG tablet Take 325 mg by mouth daily with breakfast.     loratadine (CLARITIN) 10 MG tablet Take by mouth.     Multiple Vitamin (THERA) TABS Take by mouth.     Omega-3 Fatty Acids (FISH OIL PO) Take 1 capsule  by mouth daily.     Probiotic Product (PROBIOTIC PO) Take 1 tablet by mouth daily.     ustekinumab (STELARA) 90 MG/ML SOSY injection INJECT 1 SYRINGE UNDER THE SKIN ONCE EVERY 8 WEEKS. REFRIGERATE. DO NOT FREEZE.     VITAMIN D, CHOLECALCIFEROL, PO Take by mouth.     No current facility-administered medications for this visit.    Allergies-reviewed and updated No Known Allergies  Social History   Socioeconomic History   Marital status: Married    Spouse name: Not on file   Number of children: Not on file   Years of education: Not on file   Highest education level: Not on file  Occupational History   Not on file  Tobacco Use   Smoking status: Former    Types: Cigarettes    Quit date: 12/11/2008    Years since quitting: 12.0   Smokeless tobacco: Never  Vaping Use   Vaping Use: Never used  Substance and Sexual Activity   Alcohol use: Yes    Alcohol/week: 0.0 standard drinks   Drug use: Never   Sexual activity: Yes  Other Topics Concern   Not on file  Social History Narrative   Not on file   Social Determinants of Health   Financial Resource Strain: Not on file  Food Insecurity: Not on file  Transportation Needs: Not on file  Physical Activity: Not on file  Stress: Not on file  Social Connections: Not on file        Objective:  Physical Exam: BP 123/82   Pulse 65   Temp 98.1 F (36.7 C) (Temporal)   Ht 5' 10.5" (1.791 m)   Wt 203 lb 12.8 oz (92.4 kg)   SpO2 97%   BMI 28.83 kg/m   Body mass index is 28.83 kg/m. Wt Readings from Last 3 Encounters:  12/22/20 203 lb 12.8 oz (92.4 kg)  01/11/20 209 lb (94.8 kg)  12/21/19 203 lb 3.2 oz (92.2 kg)   Gen: NAD, resting comfortably HEENT: TMs normal bilaterally. OP clear. No thyromegaly noted.  CV: RRR with no murmurs appreciated Pulm: NWOB, CTAB with no crackles, wheezes, or rhonchi GI: Normal bowel sounds present. Soft, Nontender, Nondistended. MSK: no edema, cyanosis, or clubbing noted Skin: warm,  dry Neuro: CN2-12 grossly intact. Strength 5/5 in upper and lower extremities. Reflexes symmetric and intact bilaterally.  Psych: Normal affect and thought content     I,Jordan Kelly,acting as a scribe for Dimas Chyle, MD.,have documented all relevant documentation on the behalf of Dimas Chyle, MD,as directed by  Dimas Chyle, MD while in the presence of Dimas Chyle, MD.  I, Dimas Chyle, MD, have reviewed all documentation for this visit. The documentation on 12/22/20 for the exam, diagnosis, procedures, and orders are all accurate and complete.  Algis Greenhouse. [name], MD 12/22/2020 3:16 PM

## 2020-12-22 NOTE — Assessment & Plan Note (Signed)
Stable.  He is on Stelara per GI.

## 2020-12-22 NOTE — Assessment & Plan Note (Signed)
Will place referral for sleep study.

## 2020-12-22 NOTE — Patient Instructions (Signed)
It was very nice to see you today!  We will refer you to the sleep clinic.  Please continue work on diet and exercise.  I will see you back in year for your next physical.  Please come back to see me sooner if needed.  Take care, Dr [name]  PLEASE NOTE:  If you had any lab tests please let us know if you have not heard back within a few days. You may see your results on mychart before we have a chance to review them but we will give you a call once they are reviewed by [place]. If we ordered any referrals today, please let us know if you have not heard from their office within the next week.   Please try these tips to maintain a healthy lifestyle:  Eat at least 3 REAL meals and 1-2 snacks per day.  Aim for no more than 5 hours between eating.  If you eat breakfast, please do so within one hour of getting up.   Each meal should contain half fruits/vegetables, one quarter protein, and one quarter carbs (no bigger than a computer mouse)  Cut down on sweet beverages. This includes juice, soda, and sweet tea.   Drink at least 1 glass of water with each meal and aim for at least 8 glasses per day  Exercise at least 150 minutes every week.    Preventive Care 23-28 Years Old, Male Preventive care refers to lifestyle choices and visits with your health care provider that can promote health and wellness. This includes: A yearly physical exam. This is also called an annual wellness visit. Regular dental and eye exams. Immunizations. Screening for certain conditions. Healthy lifestyle choices, such as: Eating a healthy diet. Getting regular exercise. Not using drugs or products that contain nicotine and tobacco. Limiting alcohol use. What can I expect for my preventive care visit? Physical exam Your health care provider may check your: Height and weight. These may be used to calculate your BMI (body mass index). BMI is a measurement that tells if you are at a healthy weight. Heart rate and  blood pressure. Body temperature. Skin for abnormal spots. Counseling Your health care provider may ask you questions about your: Past medical problems. Family's medical history. Alcohol, tobacco, and drug use. Emotional well-being. Home life and relationship well-being. Sexual activity. Diet, exercise, and sleep habits. Work and work Statistician. Access to firearms. What immunizations do I need?  Vaccines are usually given at various ages, according to a schedule. Your health care provider will recommend vaccines for you based on your age, medicalhistory, and lifestyle or other factors, such as travel or where you work. What tests do I need? Blood tests Lipid and cholesterol levels. These may be checked every 5 years starting at age 38. Hepatitis C test. Hepatitis B test. Screening  Diabetes screening. This is done by checking your blood sugar (glucose) after you have not eaten for a while (fasting). Genital exam to check for testicular cancer or hernias. STD (sexually transmitted disease) testing, if you are at risk. Talk with your health care provider about your test results, treatment options,and if necessary, the need for more tests. Follow these instructions at home: Eating and drinking  Eat a healthy diet that includes fresh fruits and vegetables, whole grains, lean protein, and low-fat dairy products. Drink enough fluid to keep your urine pale yellow. Take vitamin and mineral supplements as recommended by your health care provider. Do not drink alcohol if your health care  provider tells you not to drink. If you drink alcohol: Limit how much you have to 0-2 drinks a day. Be aware of how much alcohol is in your drink. In the [place] one drink equals one 12 oz bottle of beer (355 mL), one 5 oz glass of wine (148 mL), or one 1 oz glass of hard liquor (44 mL).  Lifestyle Take daily care of your teeth and gums. Brush your teeth every morning and night with fluoride  toothpaste. Floss one time each day. Stay active. Exercise for at least 30 minutes 5 or more days each week. Do not use any products that contain nicotine or tobacco, such as cigarettes, e-cigarettes, and chewing tobacco. If you need help quitting, ask your health care provider. Do not use drugs. If you are sexually active, practice safe sex. Use a condom or other form of protection to prevent STIs (sexually transmitted infections). Find healthy ways to cope with stress, such as: Meditation, yoga, or listening to music. Journaling. Talking to a trusted person. Spending time with friends and family. Safety Always wear your seat belt while driving or riding in a vehicle. Do not drive: If you have been drinking alcohol. Do not ride with someone who has been drinking. When you are tired or distracted. While texting. Wear a helmet and other protective equipment during sports activities. If you have firearms in your house, make sure you follow all gun safety procedures. Seek help if you have been physically or sexually abused. What's next? Go to your health care provider once a year for an annual wellness visit. Ask your health care provider how often you should have your eyes and teeth checked. Stay up to date on all vaccines. This information is not intended to replace advice given to you by your health care provider. Make sure you discuss any questions you have with your healthcare provider. Document Revised: 01/10/2019 Document Reviewed: 04/20/2018 Elsevier Patient Education  2022 [employer].

## 2020-12-22 NOTE — Addendum Note (Signed)
Addended by: Betti Cruz on: 12/22/2020 03:26 PM   Modules accepted: Orders

## 2020-12-24 ENCOUNTER — Encounter: Payer: Self-pay | Admitting: Family Medicine

## 2021-01-06 ENCOUNTER — Encounter: Payer: Self-pay | Admitting: Neurology

## 2021-01-06 ENCOUNTER — Ambulatory Visit (INDEPENDENT_AMBULATORY_CARE_PROVIDER_SITE_OTHER): Payer: 59 | Admitting: Neurology

## 2021-01-06 ENCOUNTER — Other Ambulatory Visit: Payer: Self-pay

## 2021-01-06 VITALS — BP 132/90 | HR 55 | Ht 70.5 in | Wt 205.0 lb

## 2021-01-06 DIAGNOSIS — G4719 Other hypersomnia: Secondary | ICD-10-CM | POA: Diagnosis not present

## 2021-01-06 DIAGNOSIS — R0683 Snoring: Secondary | ICD-10-CM | POA: Diagnosis not present

## 2021-01-06 DIAGNOSIS — I456 Pre-excitation syndrome: Secondary | ICD-10-CM | POA: Insufficient documentation

## 2021-01-06 NOTE — Patient Instructions (Signed)
Wolff-Parkinson-White Syndrome  Wolff-Parkinson-White (WPW) syndrome is a heart condition that causes a fast and irregular heartbeat (arrhythmia). The arrhythmia comes and goes suddenly. This condition is congenital. This means that people who have WPW were bornwith it. However, symptoms may not appear until the teen or adult years. What are the causes? This condition is caused by an extra electrical connection (pathway) between the top chambers of your heart (atria) and the bottom chambers of your heart (ventricles). This can cause an abnormal heart rhythm that comes and goes. What increases the risk? This condition is more likely to develop in people who: Have a family history of WPW. Have another congenital heart defect. Are male. Are less than 8 years old. What are the signs or symptoms? Symptoms of this condition include: Feeling your heart "skip" beats (palpitations). A fast heart rate. Shortness of breath. Light-headedness or dizziness. Fatigue, especially with exercise. Anxiety. Chest pain. Fainting. Irritability and trouble feeding in young children with the condition. Stopping of your heart (cardiac arrest). This is rare. Symptoms may start suddenly and last for several minutes or a few hours. Theyare often triggered by exercise. In some cases, there are no symptoms. How is this diagnosed? This condition is diagnosed based on: Your medical history. A physical exam. You may also have tests, including: Electrocardiogram (ECG). This test checks the electrical activity of your heart. Echocardiogram. This test checks your heart motion, valves, and blood flow. Ambulatory cardiac monitoring. This is a portable ECG that you wear. It checks your heart's rhythm. Stress testing. This test is an ECG that is done during exercise. Electrophysiology study. This test measures electrical activity in your heart. It uses a thin tube (catheter) that is inserted in your heart through a blood  vessel. How is this treated? Treatment for WPW depends on how often you have symptoms and what type of extra pathway you have. Treatment may include: Medicine to stop the arrhythmia. You may get this medicine through an IV to stop an attack, or you may take it by mouth to prevent an attack. Cardioversion. This is a controlled electrical shock. In extreme circumstances, it may be used to return your heart rate to normal. Radiofrequency ablation. This is a surgical procedure that uses high-frequency radio waves to destroy the extra pathway. In some cases, your health care provider may only watch your condition closelyfor any changes. Follow these instructions at home: Take over-the-counter and prescription medicines only as told by your health care provider. Follow any exercise restrictions as told by your health care provider. Do not use any products that contain nicotine or tobacco, such as cigarettes and e-cigarettes. If you need help quitting, ask your health care provider. Do not drink beverages that contain caffeine, such as coffee, soda, and tea. Do not drink alcohol. Keep all follow-up visits as told by your health care provider. This is important. Contact a health care provider if: You are having symptoms of WPW. Medicines do not control your symptoms. Get help right away if you: Have chest pain.  Have difficulty breathing. Pass out. These symptoms may represent a serious problem that is an emergency. Do not wait to see if the symptoms will go away. Get medical help right away. Call your local emergency services (911 in the [place] Do not drive yourself to the hospital. Summary Wolff-Parkinson-White (WPW) syndrome is a heart condition that causes a fast and irregular heartbeat (arrhythmia)that comes and goes suddenly. People who have this condition were born with it. However, symptoms may  not appear until the teen or adult years. This condition is caused by an extra electrical  connection (pathway) between the top chambers of your heart (atria) and the bottom chambers of your heart (ventricles). This can cause an abnormal heart rhythm that comes and goes. Treatment for WPW depends on how often you have symptoms and what type of extra pathway you have. This information is not intended to replace advice given to you by your health care provider. Make sure you discuss any questions you have with your healthcare provider. Document Revised: 06/09/2017 Document Reviewed: 06/08/2017 Elsevier Patient Education  2022 [employer].

## 2021-01-06 NOTE — Progress Notes (Signed)
SLEEP MEDICINE CLINIC    Provider:  Larey Seat, MD  Primary Care Physician:  Vivi Barrack, [place] [address]     Referring Provider: Vivi Barrack, [place],   [address]          Chief Complaint according to patient   Patient presents with:     New Patient (Initial Visit)     RM 10, alone. Internal referral for sleep disordered breathing. Wife reports he snores bad at night. Fatigued. Father has OSA. Has never had a sleep study.       HISTORY OF PRESENT ILLNESS:  Anthony Reed is a 35 y.o. year old White or Caucasian male patient and was seen here as a referral on 01/06/2021 from Dr [name], MD  for a sleep consultation.  Chief concern according to patient :  I have young kids, I drive a lot, and my wife of 10 years stated I snore louder than I ever used to. I wake up on my back, but go to sleep on my side.    I have the pleasure of seeing [name] today, a right -handed Caucasian male with a possible sleep disorder.  He  has a past medical history of rhinitis-seasonal Allergy, sinusitis, Crohn disease ([place]), bowel resection, GERD (gastroesophageal reflux disease), and Wolff-Parkinson-White (WPW) pattern.   Sleep relevant medical history: BMs at night, Sleep paralysis.    Family medical /sleep history: father with OSA, insomnia,mother with hypo- thyroidism    Social history:  Patient is working as a Biochemist, clinical, is a Psychologist, counselling and lives in a household with 3 persons/ alone. Family status is married , with 2 young children, 26 and 93 years old.   The patient currently works/ used to work in shifts( Presenter, broadcasting,) Pets are present, german shepard and a mudd.  Tobacco use quit 12 years ago .  ETOH use /4-6 weekly ,  Caffeine intake in form of Coffee( 2 cups ) Soda( in daytime ) Tea ( if eating out.) an sometimes energy drinks. Regular exercise in form of running, weight, mountain biking. .        Sleep habits are as  follows: The patient's dinner time is between 5-7 PM. The patient goes to bed at 9.30-11 PM and continues to sleep for 7 hours, wakes for BMs/ crohn's related  bathroom breaks, the first time at 4 AM.   The preferred sleep position is sideways- right, with the support of 2 pillows. Dreams are reportedly infrequent 6.10 AM  is the usual rise time. The patient wakes up with an alarm. Ready to go.  He reports usually  feeling refreshed and restored in AM, with symptoms such as dry mouth, rarely morning headaches, and residual fatigue.  Naps are taken frequently, "any chance I can get" lasting from 5 to 15 minutes and are more refreshing than nocturnal sleep.    Review of Systems: Out of a complete 14 system review, the patient complains of only the following symptoms, and all other reviewed systems are negative.:  Fatigue, sleepiness , snoring, sleep paralysis but not  vivid dreams.     How likely are you to doze in the following situations: 0 = not likely, 1 = slight chance, 2 = moderate chance, 3 = high chance   Sitting and Reading? Watching Television? Sitting inactive in a public place (theater or meeting)? As a passenger in a car for an hour without a break?  Lying down in the afternoon when circumstances permit? Sitting and talking to someone? Sitting quietly after lunch without alcohol? In a car, while stopped for a few minutes in traffic?   Total = 15/ 24 points   FSS endorsed at 39/ 63 points.   Social History   Socioeconomic History   Marital status: Married    Spouse name: Not on file   Number of children: Not on file   Years of education: Not on file   Highest education level: Not on file  Occupational History   Not on file  Tobacco Use   Smoking status: Former    Types: Cigarettes    Quit date: 12/11/2008    Years since quitting: 12.0   Smokeless tobacco: Never  Vaping Use   Vaping Use: Never used  Substance and Sexual Activity   Alcohol use: Yes    Alcohol/week:  0.0 standard drinks   Drug use: Never   Sexual activity: Yes  Other Topics Concern   Not on file  Social History Narrative   Not on file   Social Determinants of Health   Financial Resource Strain: Not on file  Food Insecurity: Not on file  Transportation Needs: Not on file  Physical Activity: Not on file  Stress: Not on file  Social Connections: Not on file    Family History  Problem Relation Age of Onset   Thyroid disease Mother    Asthma Father    Hyperlipidemia Father    Hypertension Father    Early death Maternal Grandfather    Cancer Maternal Grandfather 69   COPD Paternal Grandmother    Heart disease Paternal Grandfather    Hyperlipidemia Paternal Grandfather    Hypertension Paternal Grandfather    Kidney disease Paternal Grandfather    Asthma Sister    Colon cancer Neg Hx    Prostate cancer Neg Hx     Past Medical History:  Diagnosis Date   Allergy    Crohn disease ([place])    Crohn disease ([place])    GERD (gastroesophageal reflux disease)    Wolff-Parkinson-White (WPW) pattern     Past Surgical History:  Procedure Laterality Date   BOWEL RESECTION  2016   Small bowel     Current Outpatient Medications on File Prior to Visit  Medication Sig Dispense Refill   Ascorbic Acid (VITAMIN C PO) Take 1 tablet by mouth daily.     ferrous sulfate 325 (65 FE) MG tablet Take 325 mg by mouth daily with breakfast.     loratadine (CLARITIN) 10 MG tablet Take by mouth.     Multiple Vitamin (THERA) TABS Take by mouth.     Omega-3 Fatty Acids (FISH OIL PO) Take 1 capsule by mouth daily.     Probiotic Product (PROBIOTIC PO) Take 1 tablet by mouth daily.     ustekinumab (STELARA) 90 MG/ML SOSY injection INJECT 1 SYRINGE UNDER THE SKIN ONCE EVERY 8 WEEKS. REFRIGERATE. DO NOT FREEZE.     VITAMIN D, CHOLECALCIFEROL, PO Take by mouth.     No current facility-administered medications on file prior to visit.    No Known Allergies  Physical exam:  Today's Vitals    01/06/21 0859  BP: 132/90  Pulse: (!) 55  SpO2: 98%  Weight: 205 lb (93 kg)  Height: 5' 10.5" (1.791 m)   Body mass index is 29 kg/m.   Wt Readings from Last 3 Encounters:  01/06/21 205 lb (93 kg)  12/22/20 203 lb 12.8 oz (92.4 kg)  01/11/20 209 lb (94.8 kg)     Ht Readings from Last 3 Encounters:  01/06/21 5' 10.5" (1.791 m)  12/22/20 5' 10.5" (1.791 m)  01/11/20 5' 10.5" (1.791 m)      General: The patient is awake, alert and appears not in acute distress. The patient is well groomed. Head: Normocephalic, atraumatic. Neck is supple. Mallampati 1,  neck circumference:16 inches . Nasal airflow patent.  Retrognathia is not  seen.  Dental status: wore braces, no retrognathia.  Cardiovascular:  Regular rate and cardiac rhythm by pulse,  without distended neck veins. Respiratory: Lungs are clear to auscultation.  Skin:  Without evidence of ankle edema, or rash. Trunk: The patient's posture is erect.   Neurologic exam : The patient is awake and alert, oriented to place and time.   Memory subjective described as intact.  Attention span & concentration ability appears normal.  Speech is fluent,  without  dysarthria, dysphonia or aphasia.  Mood and affect are appropriate.   Cranial nerves: no loss of smell or taste reported  Pupils are equal and briskly reactive to light. Funduscopic exam deferred. .  Extraocular movements in vertical and horizontal planes were intact and without nystagmus. No Diplopia. Visual fields by finger perimetry are intact. Hearing was intact to soft voice and finger rubbing.  Facial sensation intact to fine touch.  Facial motor strength is symmetric and tongue and uvula move midline.  Neck ROM : rotation, tilt and flexion extension were normal for age and shoulder shrug was symmetrical.    Motor exam:  Symmetric bulk, tone and ROM.   Normal tone without cog wheeling, symmetric grip strength .   Sensory:  Fine touch and vibration were  normal. He  reports right hand numbess some mornings.  Proprioception tested in the upper extremities was normal. Coordination: Rapid alternating movements in the fingers/hands were of normal speed.  The Finger-to-nose maneuver was intact without evidence of ataxia, dysmetria or tremor.  Gait and station: Patient could rise unassisted from a seated position, walked without assistive device.  Stance is of normal width/ base and the patient turned with 3 steps.  Toe and heel walk were deferred.  Deep tendon reflexes: in the upper and lower extremities are symmetric and intact.  Babinski response was deferred.      After spending a total time of  40  minutes face to face and additional time for physical and neurologic examination, review of laboratory studies,  personal review of imaging studies, reports and results of other testing and review of referral information / records as far as provided in visit, I have established the following assessments:  1) snoring in a patient without witnessing apnea. Normal BMI, neck size and a upper airway.  2) physically active,  3) excessive daytime sleepiness.    My Plan is to proceed with:  1) HST    I would like to thank Vivi Barrack, MD and Vivi Barrack, [place],  [place] [address] for allowing me to meet with and to take care of this pleasant patient.   In short, Chares Slaymaker is presenting with snoring and EDS, a symptom that can be attributed to OSA.  He may respond to a dental device.    I plan to follow up either personally or through our NP within 3 month.   CC: I will share my notes with PCP .  Electronically signed by: Larey Seat, MD 01/06/2021 9:18 AM  [facility] [place]  Sleep Board certified by The [employer] of Sleep Medicine and Diplomate of the [employer] of Sleep Medicine. Board certified In Neurology through the [place], Fellow of the [employer] of Neurology. Medical Director of  [employer].

## 2021-01-07 ENCOUNTER — Telehealth: Payer: Self-pay | Admitting: Neurology

## 2021-01-07 NOTE — Telephone Encounter (Signed)
LVM for pt to call me back to schedule sleep study  

## 2021-01-14 ENCOUNTER — Telehealth: Payer: Self-pay | Admitting: Neurology

## 2021-01-14 NOTE — Telephone Encounter (Signed)
LVM for pt to call me back to schedule sleep study  

## 2021-01-20 ENCOUNTER — Telehealth: Payer: Self-pay

## 2021-01-20 NOTE — Telephone Encounter (Signed)
We have attempted to call the patient 2 times to schedule sleep study. Patient has been unavailable at the phone numbers we have on file and has not returned our calls. If patient calls back we will schedule them for their sleep study. ° °

## 2021-02-01 ENCOUNTER — Encounter (HOSPITAL_BASED_OUTPATIENT_CLINIC_OR_DEPARTMENT_OTHER): Payer: Self-pay | Admitting: Emergency Medicine

## 2021-02-01 ENCOUNTER — Other Ambulatory Visit: Payer: Self-pay

## 2021-02-01 ENCOUNTER — Observation Stay (HOSPITAL_BASED_OUTPATIENT_CLINIC_OR_DEPARTMENT_OTHER)
Admission: EM | Admit: 2021-02-01 | Discharge: 2021-02-02 | Disposition: A | Discharge status: Home / Self Care | Payer: 59 | Attending: Internal Medicine | Admitting: Internal Medicine

## 2021-02-01 DIAGNOSIS — K56600 Partial intestinal obstruction, unspecified as to cause: Principal | ICD-10-CM | POA: Diagnosis present

## 2021-02-01 DIAGNOSIS — Z20822 Contact with and (suspected) exposure to covid-19: Secondary | ICD-10-CM | POA: Diagnosis not present

## 2021-02-01 DIAGNOSIS — R1084 Generalized abdominal pain: Secondary | ICD-10-CM | POA: Diagnosis present

## 2021-02-01 DIAGNOSIS — K56609 Unspecified intestinal obstruction, unspecified as to partial versus complete obstruction: Secondary | ICD-10-CM

## 2021-02-01 DIAGNOSIS — Z87891 Personal history of nicotine dependence: Secondary | ICD-10-CM | POA: Insufficient documentation

## 2021-02-01 DIAGNOSIS — Z8719 Personal history of other diseases of the digestive system: Secondary | ICD-10-CM

## 2021-02-01 LAB — CBC
HCT: 46.6 % (ref 39.0–52.0)
Hemoglobin: 16.7 g/dL (ref 13.0–17.0)
MCH: 30 pg (ref 26.0–34.0)
MCHC: 35.8 g/dL (ref 30.0–36.0)
MCV: 83.7 fL (ref 80.0–100.0)
Platelets: 298 10*3/uL (ref 150–400)
RBC: 5.57 MIL/uL (ref 4.22–5.81)
RDW: 12 % (ref 11.5–15.5)
WBC: 10.2 10*3/uL (ref 4.0–10.5)
nRBC: 0 % (ref 0.0–0.2)

## 2021-02-01 LAB — COMPREHENSIVE METABOLIC PANEL
ALT: 22 U/L (ref 0–44)
AST: 23 U/L (ref 15–41)
Albumin: 4.6 g/dL (ref 3.5–5.0)
Alkaline Phosphatase: 53 U/L (ref 38–126)
Anion gap: 12 (ref 5–15)
BUN: 19 mg/dL (ref 6–20)
CO2: 25 mmol/L (ref 22–32)
Calcium: 10.2 mg/dL (ref 8.9–10.3)
Chloride: 103 mmol/L (ref 98–111)
Creatinine, Ser: 1.25 mg/dL — ABNORMAL HIGH (ref 0.61–1.24)
GFR, Estimated: >60 mL/min (ref >60)
Glucose, Bld: 90 mg/dL (ref 70–99)
Potassium: 3.8 mmol/L (ref 3.5–5.1)
Sodium: 140 mmol/L (ref 135–145)
Total Bilirubin: 1.8 mg/dL — ABNORMAL HIGH (ref 0.3–1.2)
Total Protein: 7.1 g/dL (ref 6.5–8.1)

## 2021-02-01 LAB — URINALYSIS, ROUTINE W REFLEX MICROSCOPIC
Bilirubin Urine: NEGATIVE
Glucose, UA: NEGATIVE mg/dL
Hgb urine dipstick: NEGATIVE
Ketones, ur: 15 mg/dL — AB
Leukocytes,Ua: NEGATIVE
Nitrite: NEGATIVE
Protein, ur: NEGATIVE mg/dL
Specific Gravity, Urine: 1.019 (ref 1.005–1.030)
pH: 6.5 (ref 5.0–8.0)

## 2021-02-01 LAB — LIPASE, BLOOD: Lipase: 34 U/L (ref 11–51)

## 2021-02-01 MED ORDER — IOHEXOL 9 MG/ML PO SOLN
500.0000 mL | ORAL | Status: AC
Start: 2021-02-01 — End: 2021-02-02

## 2021-02-01 MED ORDER — LACTATED RINGERS IV BOLUS
1000.0000 mL | Freq: Once | INTRAVENOUS | Status: AC
  Administered 2021-02-01: 1000 mL via INTRAVENOUS

## 2021-02-01 MED ORDER — ONDANSETRON HCL 4 MG/2ML IJ SOLN
4.0000 mg | Freq: Once | INTRAMUSCULAR | Status: AC | PRN
  Administered 2021-02-01: 4 mg via INTRAVENOUS
  Filled 2021-02-01: qty 2

## 2021-02-01 MED ORDER — HYDROMORPHONE HCL 1 MG/ML IJ SOLN
1.0000 mg | Freq: Once | INTRAMUSCULAR | Status: AC
  Administered 2021-02-01: 1 mg via INTRAVENOUS
  Filled 2021-02-01: qty 1

## 2021-02-01 NOTE — ED Provider Notes (Signed)
[facility] Provider Note   CSN: [id number] Arrival date & time: 02/01/21  1949     History Chief Complaint  Patient presents with   Abdominal Pain    Anthony Reed is a 35 y.o. male.   Abdominal Pain Pain location:  Generalized Pain quality: cramping   Pain radiates to:  Does not radiate Pain severity:  Severe Onset quality:  Gradual Duration:  4 hours Timing:  Constant Progression:  Worsening Chronicity:  Recurrent Context comment:  Crohn's, prior surgeries, hx of SBO Relieved by:  Nothing Ineffective treatments:  None tried Associated symptoms: hematochezia, nausea and vomiting   Associated symptoms: no chest pain, no chills, no cough, no diarrhea, no dysuria, no fatigue, no fever, no flatus, no hematuria, no shortness of breath and no sore throat   Patient has undergone 1 surgical procedure, during which they performed 2 resections of a small bowel.  He has since had recurrent episodes of small bowel obstruction.  He has not required surgical management for these obstructions.  He is typically monitored and has resolution.  Patient is followed by [facility].  He currently gets Stelara infusions every 8 weeks for maintenance therapy.  Last flare/concern of bowel obstruction was in May.  Last steroids were taken in May.  Patient was diagnosed with Crohn's disease 6 years ago.  He believes that he likely had the disease for 3 years prior to that.    Past Medical History:  Diagnosis Date   Allergy    Crohn disease ([place])    Crohn disease ([place])    GERD (gastroesophageal reflux disease)    Wolff-Parkinson-White (WPW) pattern     Patient Active Problem List   Diagnosis Date Noted   WPW (Wolff-Parkinson-White syndrome) 01/06/2021   Snoring 01/06/2021   Excessive daytime sleepiness 01/06/2021   Sleep-disordered breathing 12/22/2020   Crohn's disease with complication ([place]) 03/03/8526   Wolff-Parkinson-White (WPW) pattern     Past Surgical  History:  Procedure Laterality Date   BOWEL RESECTION  2016   Small bowel       Family History  Problem Relation Age of Onset   Thyroid disease Mother    Asthma Father    Hyperlipidemia Father    Hypertension Father    Early death Maternal Grandfather    Cancer Maternal Grandfather 58   COPD Paternal Grandmother    Heart disease Paternal Grandfather    Hyperlipidemia Paternal Grandfather    Hypertension Paternal Grandfather    Kidney disease Paternal Grandfather    Asthma Sister    Colon cancer Neg Hx    Prostate cancer Neg Hx     Social History   Tobacco Use   Smoking status: Former    Types: Cigarettes    Quit date: 12/11/2008    Years since quitting: 12.1   Smokeless tobacco: Never  Vaping Use   Vaping Use: Never used  Substance Use Topics   Alcohol use: Yes    Alcohol/week: 0.0 standard drinks   Drug use: Never    Home Medications Prior to Admission medications   Medication Sig Start Date End Date Taking? Authorizing Provider  Ascorbic Acid (VITAMIN C PO) Take 1 tablet by mouth daily.    [provider]  ferrous sulfate 325 (65 FE) MG tablet Take 325 mg by mouth daily with breakfast.    [provider]  loratadine (CLARITIN) 10 MG tablet Take by mouth.    [provider]  Multiple Vitamin (THERA) TABS Take by mouth.  [provider]  Omega-3 Fatty Acids (FISH OIL PO) Take 1 capsule by mouth daily.    [provider]  Probiotic Product (PROBIOTIC PO) Take 1 tablet by mouth daily.    [provider]  ustekinumab (STELARA) 90 MG/ML SOSY injection INJECT 1 SYRINGE UNDER THE SKIN ONCE EVERY 8 WEEKS. REFRIGERATE. DO NOT FREEZE. 03/19/20   [provider]  VITAMIN D, CHOLECALCIFEROL, PO Take by mouth.    [provider]    Allergies    Patient has no known allergies.  Review of Systems   Review of Systems  Constitutional:  Positive for appetite change. Negative for chills, diaphoresis,  fatigue and fever.  HENT:  Negative for ear pain and sore throat.   Eyes:  Negative for pain and visual disturbance.  Respiratory:  Negative for cough, chest tightness and shortness of breath.   Cardiovascular:  Negative for chest pain and palpitations.  Gastrointestinal:  Positive for abdominal distention, abdominal pain, blood in stool, hematochezia, nausea and vomiting. Negative for diarrhea and flatus.  Genitourinary:  Negative for dysuria, flank pain, hematuria and testicular pain.  Musculoskeletal:  Negative for arthralgias, back pain, myalgias and neck pain.  Skin:  Negative for color change and rash.  Neurological:  Negative for dizziness, seizures, syncope, weakness, light-headedness and numbness.  All other systems reviewed and are negative.  Physical Exam Updated Vital Signs BP 114/86   Pulse (!) 59   Temp 97.9 F (36.6 C) (Oral)   Resp 18   Ht 5' 11"  (1.803 m)   Wt 90.7 kg   SpO2 98%   BMI 27.89 kg/m   Physical Exam Vitals and nursing note reviewed.  Constitutional:      General: He is not in acute distress.    Appearance: He is well-developed and normal weight. He is not ill-appearing, toxic-appearing or diaphoretic.  HENT:     Head: Normocephalic and atraumatic.     Mouth/Throat:     Mouth: Mucous membranes are moist.     Pharynx: Oropharynx is clear.  Eyes:     General: No scleral icterus.    Extraocular Movements: Extraocular movements intact.     Conjunctiva/sclera: Conjunctivae normal.  Cardiovascular:     Rate and Rhythm: Normal rate and regular rhythm.     Heart sounds: No murmur heard. Pulmonary:     Effort: Pulmonary effort is normal. No respiratory distress.     Breath sounds: Normal breath sounds.  Abdominal:     Palpations: Abdomen is soft.     Tenderness: There is abdominal tenderness (Maximal tenderness across mid abdomen). There is no guarding or rebound.  Musculoskeletal:     Cervical back: Neck supple.  Skin:    General: Skin is warm  and dry.     Coloration: Skin is not jaundiced or pale.  Neurological:     General: No focal deficit present.     Mental Status: He is alert and oriented to person, place, and time.     Cranial Nerves: No cranial nerve deficit.     Motor: No weakness.  Psychiatric:        Mood and Affect: Mood normal.        Behavior: Behavior normal.    ED Results / Procedures / Treatments   Labs (all labs ordered are listed, but only abnormal results are displayed) Labs Reviewed  COMPREHENSIVE METABOLIC PANEL - Abnormal; Notable for the following components:      Result Value   Creatinine, Ser 1.25 (*)  Total Bilirubin 1.8 (*)    All other components within normal limits  URINALYSIS, ROUTINE W REFLEX MICROSCOPIC - Abnormal; Notable for the following components:   Ketones, ur 15 (*)    All other components within normal limits  RESP PANEL BY RT-PCR (FLU A&B, COVID) ARPGX2  LIPASE, BLOOD  CBC    EKG None  Radiology CT ABDOMEN PELVIS W CONTRAST  Result Date: 02/02/2021 CLINICAL DATA:  Crohn's disease, abdominal pain and cramping, vomiting, bright red blood per rectum. EXAM: CT ABDOMEN AND PELVIS WITH CONTRAST TECHNIQUE: Multidetector CT imaging of the abdomen and pelvis was performed using the standard protocol following bolus administration of intravenous contrast. CONTRAST:  29m OMNIPAQUE IOHEXOL 350 MG/ML SOLN COMPARISON:  None. FINDINGS: Lower chest: The visualized lung bases are clear. The visualized heart and pericardium are unremarkable. Hepatobiliary: No focal liver abnormality is seen. No gallstones, gallbladder wall thickening, or biliary dilatation. Pancreas: Unremarkable Spleen: Unremarkable Adrenals/Urinary Tract: Adrenal glands are unremarkable. Kidneys are normal, without renal calculi, focal lesion, or hydronephrosis. Bladder is unremarkable. Stomach/Bowel: Surgical changes of ileocolectomy as well as small bowel resection x2 has been performed. There are multiple dilated  fluid-filled loops of small bowel identified proximal to both small bowel anastomosis in keeping with serial points of partial small bowel obstruction. Initial transition point is seen within the deep pelvis at axial image # 64/2 and sagittal image # 66/6. Subsequent stenotic segment is seen within the right lower quadrant at axial image # 52/2 and coronal image # 64/5. There is gas and stool seen throughout the colon in keeping with a partial serial small-bowel obstruction. Normal enhancement of the bowel. No evidence of focal inflammation. Mild ascites. No free intraperitoneal gas. Vascular/Lymphatic: The abdominal vasculature is unremarkable. No pathologic adenopathy within the abdomen and pelvis. Reproductive: Prostate is unremarkable. Other: Tiny bilateral fat containing inguinal hernia. The rectum is unremarkable. Musculoskeletal: No acute bone abnormality. No lytic or blastic bone lesion. IMPRESSION: Status post ileocolectomy and small-bowel resection x2. Serial partial small bowel obstruction in the region of both small bowel anastomosis with moderate upstream dilation of several loops of small bowel. No superimposed focal enteric inflammatory change. Mild ascites. Electronically Signed   By: AFidela SalisburyM.D.   On: 02/02/2021 00:58    Procedures Procedures   Medications Ordered in ED Medications  iohexol (OMNIPAQUE) 9 MG/ML oral solution 500 mL ( Oral Canceled Entry 02/02/21 0057)  0.9 %  sodium chloride infusion (has no administration in time range)  HYDROmorphone (DILAUDID) injection 1 mg (has no administration in time range)  lactated ringers bolus 1,000 mL (0 mLs Intravenous Stopped 02/01/21 2154)  HYDROmorphone (DILAUDID) injection 1 mg (1 mg Intravenous Given 02/01/21 2042)  ondansetron (ZOFRAN) injection 4 mg (4 mg Intravenous Given 02/01/21 2042)  iohexol (OMNIPAQUE) 350 MG/ML injection 80 mL (80 mLs Intravenous Contrast Given 02/02/21 0027)  HYDROmorphone (DILAUDID) injection 1 mg (1 mg  Intravenous Given 02/02/21 0045)    ED Course  I have reviewed the triage vital signs and the nursing notes.  Pertinent labs & imaging results that were available during my care of the patient were reviewed by me and considered in my medical decision making (see chart for details).    MDM Rules/Calculators/A&P                           Patient presents for 4 hours of abdominal pain, nausea, vomiting, and p.o. intolerance.  He has had multiple episodes of  partial small bowel obstructions which he states have been identical to the symptoms he is experiencing now.  On arrival, patient has normal vital signs.  On exam, he does appear uncomfortable.  He has generalized abdominal tenderness that is greatest across his mid abdomen.  Patient does feel that his abdomen is distended, although this is difficult to identify on exam.  Bolus of IV fluids was ordered.  Dilaudid was ordered for analgesia.  Patient denies any current nausea.  As needed Zofran was ordered.  Laboratory studies were ordered, the results of which showed baseline creatinine, normal electrolytes, no leukocytosis, normal lipase, and no evidence of urine infection.  On reassessment, patient reports significant improvement in his symptoms.  CT scan of abdomen and pelvis was ordered to identify possible SBO or other complication from his Crohn's disease.  Care of patient was signed out to oncoming ED provider.  Final Clinical Impression(s) / ED Diagnoses Final diagnoses:  SBO (small bowel obstruction) ([place])  History of Crohn's disease    Rx / DC Orders ED Discharge Orders     None        Godfrey Pick, MD 02/02/21 0134

## 2021-02-01 NOTE — ED Notes (Signed)
  Pt. States his pain has improved. VSS. Instructed to call for any needs.

## 2021-02-01 NOTE — ED Triage Notes (Addendum)
Pt h/o crohns disease.  Reports episode of pain and cramping that started today, along with vomiting for past 4 hours.  Pt states round of bright red blood in stool

## 2021-02-02 ENCOUNTER — Inpatient Hospital Stay (HOSPITAL_COMMUNITY): Payer: 59

## 2021-02-02 ENCOUNTER — Observation Stay (HOSPITAL_COMMUNITY): Payer: 59

## 2021-02-02 ENCOUNTER — Encounter (HOSPITAL_BASED_OUTPATIENT_CLINIC_OR_DEPARTMENT_OTHER): Payer: Self-pay | Admitting: Radiology

## 2021-02-02 ENCOUNTER — Emergency Department (HOSPITAL_BASED_OUTPATIENT_CLINIC_OR_DEPARTMENT_OTHER): Payer: 59

## 2021-02-02 DIAGNOSIS — K56600 Partial intestinal obstruction, unspecified as to cause: Secondary | ICD-10-CM | POA: Diagnosis present

## 2021-02-02 DIAGNOSIS — Z8719 Personal history of other diseases of the digestive system: Secondary | ICD-10-CM

## 2021-02-02 LAB — CBC
HCT: 42 % (ref 39.0–52.0)
Hemoglobin: 14.7 g/dL (ref 13.0–17.0)
MCH: 30.1 pg (ref 26.0–34.0)
MCHC: 35 g/dL (ref 30.0–36.0)
MCV: 86.1 fL (ref 80.0–100.0)
Platelets: 224 10*3/uL (ref 150–400)
RBC: 4.88 MIL/uL (ref 4.22–5.81)
RDW: 12.1 % (ref 11.5–15.5)
WBC: 9.4 10*3/uL (ref 4.0–10.5)
nRBC: 0 % (ref 0.0–0.2)

## 2021-02-02 LAB — BASIC METABOLIC PANEL
Anion gap: 5 (ref 5–15)
BUN: 17 mg/dL (ref 6–20)
CO2: 28 mmol/L (ref 22–32)
Calcium: 8.8 mg/dL — ABNORMAL LOW (ref 8.9–10.3)
Chloride: 105 mmol/L (ref 98–111)
Creatinine, Ser: 1.16 mg/dL (ref 0.61–1.24)
GFR, Estimated: >60 mL/min (ref >60)
Glucose, Bld: 97 mg/dL (ref 70–99)
Potassium: 4 mmol/L (ref 3.5–5.1)
Sodium: 138 mmol/L (ref 135–145)

## 2021-02-02 LAB — RESP PANEL BY RT-PCR (FLU A&B, COVID) ARPGX2
Influenza A by PCR: NEGATIVE
Influenza B by PCR: NEGATIVE
SARS Coronavirus 2 by RT PCR: NEGATIVE

## 2021-02-02 LAB — HIV ANTIBODY (ROUTINE TESTING W REFLEX): HIV Screen 4th Generation wRfx: NONREACTIVE

## 2021-02-02 MED ORDER — IOHEXOL 350 MG/ML SOLN
80.0000 mL | Freq: Once | INTRAVENOUS | Status: AC | PRN
  Administered 2021-02-02: 80 mL via INTRAVENOUS

## 2021-02-02 MED ORDER — DIATRIZOATE MEGLUMINE & SODIUM 66-10 % PO SOLN
90.0000 mL | Freq: Once | ORAL | Status: AC
  Administered 2021-02-02: 90 mL via ORAL
  Filled 2021-02-02: qty 90

## 2021-02-02 MED ORDER — ONDANSETRON HCL 4 MG/2ML IJ SOLN
4.0000 mg | Freq: Four times a day (QID) | INTRAMUSCULAR | Status: DC | PRN
  Administered 2021-02-02: 4 mg via INTRAVENOUS
  Filled 2021-02-02: qty 2

## 2021-02-02 MED ORDER — HYDROMORPHONE HCL 1 MG/ML IJ SOLN: 1.0000 mg | INTRAMUSCULAR | Status: DC | PRN

## 2021-02-02 MED ORDER — SODIUM CHLORIDE 0.9 % IV SOLN: Freq: Once | INTRAVENOUS | Status: AC

## 2021-02-02 MED ORDER — SODIUM CHLORIDE 0.9 % IV SOLN: INTRAVENOUS | Status: DC

## 2021-02-02 MED ORDER — HYDROMORPHONE HCL 1 MG/ML IJ SOLN
1.0000 mg | Freq: Once | INTRAMUSCULAR | Status: AC
  Administered 2021-02-02: 1 mg via INTRAVENOUS
  Filled 2021-02-02: qty 1

## 2021-02-02 MED ORDER — HYDROMORPHONE HCL 1 MG/ML IJ SOLN
1.0000 mg | INTRAMUSCULAR | Status: DC | PRN
  Administered 2021-02-02: 1 mg via INTRAVENOUS
  Filled 2021-02-02: qty 1

## 2021-02-02 NOTE — Discharge Instructions (Signed)

## 2021-02-02 NOTE — Consult Note (Signed)
Anthony Reed 02/19/1986  [id number].    Requesting MD: Dr. Algis Liming Chief Complaint/Reason for Consult: SBO  HPI: Anthony Reed is a 35 y.o. male with a hx of Crohn's ileocolitis with history of prior robotic ileocecectomy with SBR x2 in 2017 who is currently followed by [facility], Dr. Shary Key, and is on Stelara (last dose August) who presented for abdominal pain, nausea and vomiting.  Patient reports that yesterday he had a large formed bowel movement in the morning and felt in his normal state if health.  In the mid afternoon he started developing a dull, achy generalized abdominal pain.  A few hours later he began getting a bandlike cramping, severe pain that went across his mid abdomen with associated nausea and vomiting.  This feels similar to when he has had prior SBO's.  He notes he was unable to pass flatus.  He did have some mucus and a small amount of blood that he passed per his rectum which she also reports is typical when he has had SBO's in the past. Workup with CT A/P w/ serial partial small bowel obstruction in the region of both small bowel anastomosis with moderate upstream dilation of several loops of small bowel. There as no superimposed focal enteric inflammatory change.   Patient reports that since admission his abdominal pain has completely resolved.  He denies any further nausea or emesis.  He has began passing flatus.  No BM since admission.  He does not wish for an NG tube.  His last colonoscopy was on 02/13/2020.  I am unable to see the full report but per notes this showed active disease near the anastomosis.  At the time he was on Humira and had his dose increased with a follow-up MR enterography that showed no active disease.  He was eventually switched to the Stelara which is what he is currently on.   No other abdominal surgery than as mentioned above.  He is not on any blood thinners.  He does not take any other daily medications.  ROS: Review of Systems   Constitutional:  Negative for chills and fever.  Respiratory:  Negative for cough and shortness of breath.   Cardiovascular:  Negative for chest pain.  Gastrointestinal:  Positive for abdominal pain, blood in stool, nausea and vomiting. Negative for diarrhea.  All other systems reviewed and are negative.  Family History  Problem Relation Age of Onset   Thyroid disease Mother    Asthma Father    Hyperlipidemia Father    Hypertension Father    Early death Maternal Grandfather    Cancer Maternal Grandfather 34   COPD Paternal Grandmother    Heart disease Paternal Grandfather    Hyperlipidemia Paternal Grandfather    Hypertension Paternal Grandfather    Kidney disease Paternal Grandfather    Asthma Sister    Colon cancer Neg Hx    Prostate cancer Neg Hx     Past Medical History:  Diagnosis Date   Allergy    Crohn disease ([place])    Crohn disease ([place])    GERD (gastroesophageal reflux disease)    Wolff-Parkinson-White (WPW) pattern     Past Surgical History:  Procedure Laterality Date   BOWEL RESECTION  2016   Small bowel    Social History:  reports that he quit smoking about 12 years ago. His smoking use included cigarettes. He has never used smokeless tobacco. He reports current alcohol use. He reports that he does not use drugs.  Allergies:  No Known Allergies  Medications Prior to Admission  Medication Sig Dispense Refill   ferrous sulfate 325 (65 FE) MG tablet Take 325 mg by mouth daily with breakfast.     loratadine (CLARITIN) 10 MG tablet Take 10 mg by mouth daily as needed for itching, rhinitis or allergies.     Multiple Vitamin (THERA) TABS Take by mouth.     Omega-3 Fatty Acids (FISH OIL PO) Take 1 capsule by mouth daily.     Probiotic Product (PROBIOTIC PO) Take 1 tablet by mouth daily.     ustekinumab (STELARA) 90 MG/ML SOSY injection INJECT 1 SYRINGE UNDER THE SKIN ONCE EVERY 8 WEEKS. REFRIGERATE. DO NOT FREEZE.     Ascorbic Acid (VITAMIN C PO) Take 1 tablet  by mouth daily. (Patient not taking: Reported on 02/02/2021)     VITAMIN D, CHOLECALCIFEROL, PO Take by mouth. (Patient not taking: No sig reported)       Physical Exam: Blood pressure 107/68, pulse (!) 50, temperature (!) 97.2 F (36.2 C), temperature source Oral, resp. rate 18, height 5' 11"  (1.803 m), weight 90.7 kg, SpO2 97 %. General: pleasant, WD/WN white male who is laying in bed in NAD HEENT: head is normocephalic, atraumatic.  Sclera are noninjected.  PERRL.  Ears and nose without any masses or lesions.  Mouth is pink and moist. Dentition fair Heart: regular, rate, and rhythm.  Normal s1,s2. No obvious murmurs, gallops, or rubs noted.  Palpable pedal pulses bilaterally  Lungs: CTAB, no wheezes, rhonchi, or rales noted.  Respiratory effort nonlabored Abd:  Soft, NT/ND, +BS, no masses, hernias, or organomegaly. Prior abdominal scar is well healed MS: no BUE/BLE edema, calves soft and nontender Skin: warm and dry with no masses, lesions, or rashes Psych: A&Ox4 with an appropriate affect Neuro: cranial nerves grossly intact, equal strength in BUE/BLE bilaterally, normal speech, thought process intact, moves all extremities, gait not assessed   Results for orders placed or performed during the hospital encounter of 02/01/21 (from the past 48 hour(s))  Lipase, blood     Status: None   Collection Time: 02/01/21  8:13 PM  Result Value Ref Range   Lipase 34 11 - 51 U/L    Comment: Performed at [facility], [address] [place], [place] [address]  Comprehensive metabolic panel     Status: Abnormal   Collection Time: 02/01/21  8:13 PM  Result Value Ref Range   Sodium 140 135 - 145 mmol/L   Potassium 3.8 3.5 - 5.1 mmol/L   Chloride 103 98 - 111 mmol/L   CO2 25 22 - 32 mmol/L   Glucose, Bld 90 70 - 99 mg/dL    Comment: Glucose reference range applies only to samples taken after fasting for at least 8 hours.   BUN 19 6 - 20 mg/dL   Creatinine, Ser 1.25 (H) 0.61 -  1.24 mg/dL   Calcium 10.2 8.9 - 10.3 mg/dL   Total Protein 7.1 6.5 - 8.1 g/dL   Albumin 4.6 3.5 - 5.0 g/dL   AST 23 15 - 41 U/L   ALT 22 0 - 44 U/L   Alkaline Phosphatase 53 38 - 126 U/L   Total Bilirubin 1.8 (H) 0.3 - 1.2 mg/dL   GFR, Estimated >60 >60 mL/min    Comment: (NOTE) Calculated using the CKD-EPI Creatinine Equation (2021)    Anion gap 12 5 - 15    Comment: Performed at [facility], [address], [place], [place] [address]  CBC  Status: None   Collection Time: 02/01/21  8:13 PM  Result Value Ref Range   WBC 10.2 4.0 - 10.5 K/uL   RBC 5.57 4.22 - 5.81 MIL/uL   Hemoglobin 16.7 13.0 - 17.0 g/dL   HCT 46.6 39.0 - 52.0 %   MCV 83.7 80.0 - 100.0 fL   MCH 30.0 26.0 - 34.0 pg   MCHC 35.8 30.0 - 36.0 g/dL   RDW 12.0 11.5 - 15.5 %   Platelets 298 150 - 400 K/uL   nRBC 0.0 0.0 - 0.2 %    Comment: Performed at [facility], [place], [place] [address]  Urinalysis, Routine w reflex microscopic Urine, Clean Catch     Status: Abnormal   Collection Time: 02/01/21 11:13 PM  Result Value Ref Range   Color, Urine YELLOW YELLOW   APPearance CLEAR CLEAR   Specific Gravity, Urine 1.019 1.005 - 1.030   pH 6.5 5.0 - 8.0   Glucose, UA NEGATIVE NEGATIVE mg/dL   Hgb urine dipstick NEGATIVE NEGATIVE   Bilirubin Urine NEGATIVE NEGATIVE   Ketones, ur 15 (A) NEGATIVE mg/dL   Protein, ur NEGATIVE NEGATIVE mg/dL   Nitrite NEGATIVE NEGATIVE   Leukocytes,Ua NEGATIVE NEGATIVE    Comment: Performed at [facility], [place], [place] [address]  Resp Panel by RT-PCR (Flu A&B, Covid) Nasopharyngeal Swab     Status: None   Collection Time: 02/02/21  1:50 AM   Specimen: Nasopharyngeal Swab; Nasopharyngeal(NP) swabs in vial transport medium  Result Value Ref Range   SARS Coronavirus 2 by RT PCR NEGATIVE NEGATIVE    Comment: (NOTE) SARS-CoV-2 target nucleic acids are NOT DETECTED.  The SARS-CoV-2 RNA is  generally detectable in upper respiratory specimens during the acute phase of infection. The lowest concentration of SARS-CoV-2 viral copies this assay can detect is 138 copies/mL. A negative result does not preclude SARS-Cov-2 infection and should not be used as the sole basis for treatment or other patient management decisions. A negative result may occur with  improper specimen collection/handling, submission of specimen other than nasopharyngeal swab, presence of viral mutation(s) within the areas targeted by this assay, and inadequate number of viral copies(<138 copies/mL). A negative result must be combined with clinical observations, patient history, and epidemiological information. The expected result is Negative.  Fact Sheet for Patients:  EntrepreneurPulse.com.au  Fact Sheet for Healthcare Providers:  IncredibleEmployment.be  This test is no t yet approved or cleared by the [place] FDA and  has been authorized for detection and/or diagnosis of SARS-CoV-2 by FDA under an Emergency Use Authorization (EUA). This EUA will remain  in effect (meaning this test can be used) for the duration of the COVID-19 declaration under Section 564(b)(1) of the Act, 21 U.S.C.section 360bbb-3(b)(1), unless the authorization is terminated  or revoked sooner.       Influenza A by PCR NEGATIVE NEGATIVE   Influenza B by PCR NEGATIVE NEGATIVE    Comment: (NOTE) The Xpert Xpress SARS-CoV-2/FLU/RSV plus assay is intended as an aid in the diagnosis of influenza from Nasopharyngeal swab specimens and should not be used as a sole basis for treatment. Nasal washings and aspirates are unacceptable for Xpert Xpress SARS-CoV-2/FLU/RSV testing.  Fact Sheet for Patients: EntrepreneurPulse.com.au  Fact Sheet for Healthcare Providers: IncredibleEmployment.be  This test is not yet approved or cleared by the [place] FDA  and has been authorized for detection and/or diagnosis of SARS-CoV-2 by FDA under an Emergency Use Authorization (EUA). This EUA  will remain in effect (meaning this test can be used) for the duration of the COVID-19 declaration under Section 564(b)(1) of the Act, 21 U.S.C. section 360bbb-3(b)(1), unless the authorization is terminated or revoked.  Performed at [facility], [address], [place], [place] [address]   Basic metabolic panel     Status: Abnormal   Collection Time: 02/02/21  4:59 AM  Result Value Ref Range   Sodium 138 135 - 145 mmol/L   Potassium 4.0 3.5 - 5.1 mmol/L   Chloride 105 98 - 111 mmol/L   CO2 28 22 - 32 mmol/L   Glucose, Bld 97 70 - 99 mg/dL    Comment: Glucose reference range applies only to samples taken after fasting for at least 8 hours.   BUN 17 6 - 20 mg/dL   Creatinine, Ser 1.16 0.61 - 1.24 mg/dL   Calcium 8.8 (L) 8.9 - 10.3 mg/dL   GFR, Estimated >60 >60 mL/min    Comment: (NOTE) Calculated using the CKD-EPI Creatinine Equation (2021)    Anion gap 5 5 - 15    Comment: Performed at [facility], [place] [address] [place], [place] [address]  CBC     Status: None   Collection Time: 02/02/21  4:59 AM  Result Value Ref Range   WBC 9.4 4.0 - 10.5 K/uL   RBC 4.88 4.22 - 5.81 MIL/uL   Hemoglobin 14.7 13.0 - 17.0 g/dL   HCT 42.0 39.0 - 52.0 %   MCV 86.1 80.0 - 100.0 fL   MCH 30.1 26.0 - 34.0 pg   MCHC 35.0 30.0 - 36.0 g/dL   RDW 12.1 11.5 - 15.5 %   Platelets 224 150 - 400 K/uL   nRBC 0.0 0.0 - 0.2 %    Comment: Performed at [facility], [place] [address] [place], [place] [address]   CT ABDOMEN PELVIS W CONTRAST  Result Date: 02/02/2021 CLINICAL DATA:  Crohn's disease, abdominal pain and cramping, vomiting, bright red blood per rectum. EXAM: CT ABDOMEN AND PELVIS WITH CONTRAST TECHNIQUE: Multidetector CT imaging of the abdomen and pelvis was performed using the standard protocol following  bolus administration of intravenous contrast. CONTRAST:  41m OMNIPAQUE IOHEXOL 350 MG/ML SOLN COMPARISON:  None. FINDINGS: Lower chest: The visualized lung bases are clear. The visualized heart and pericardium are unremarkable. Hepatobiliary: No focal liver abnormality is seen. No gallstones, gallbladder wall thickening, or biliary dilatation. Pancreas: Unremarkable Spleen: Unremarkable Adrenals/Urinary Tract: Adrenal glands are unremarkable. Kidneys are normal, without renal calculi, focal lesion, or hydronephrosis. Bladder is unremarkable. Stomach/Bowel: Surgical changes of ileocolectomy as well as small bowel resection x2 has been performed. There are multiple dilated fluid-filled loops of small bowel identified proximal to both small bowel anastomosis in keeping with serial points of partial small bowel obstruction. Initial transition point is seen within the deep pelvis at axial image # 64/2 and sagittal image # 66/6. Subsequent stenotic segment is seen within the right lower quadrant at axial image # 52/2 and coronal image # 64/5. There is gas and stool seen throughout the colon in keeping with a partial serial small-bowel obstruction. Normal enhancement of the bowel. No evidence of focal inflammation. Mild ascites. No free intraperitoneal gas. Vascular/Lymphatic: The abdominal vasculature is unremarkable. No pathologic adenopathy within the abdomen and pelvis. Reproductive: Prostate is unremarkable. Other: Tiny bilateral fat containing inguinal hernia. The rectum is unremarkable. Musculoskeletal: No acute bone abnormality. No lytic or blastic bone lesion. IMPRESSION: Status post ileocolectomy and small-bowel resection  x2. Serial partial small bowel obstruction in the region of both small bowel anastomosis with moderate upstream dilation of several loops of small bowel. No superimposed focal enteric inflammatory change. Mild ascites. Electronically Signed   By: Fidela Salisbury M.D.   On: 02/02/2021 00:58    DG Abd Portable 1V  Result Date: 02/02/2021 CLINICAL DATA:  Evaluate for small bowel obstruction. Abdominal pain. EXAM: PORTABLE ABDOMEN - 1 VIEW COMPARISON:  CT 02/02/2021 FINDINGS: Supine images of the abdomen pelvis demonstrate gas in the small and large bowel. Mildly dilated loops of small bowel in the left abdomen. Small bowel distension has slightly decreased from the CT tomogram on the same day. No large abdominopelvic calcifications. IMPRESSION: Small bowel distension has slightly decreased from the study earlier on 02/02/2021. No evidence for a high-grade small bowel obstruction. Electronically Signed   By: Markus Daft M.D.   On: 02/02/2021 08:52    Anti-infectives (From admission, onward)    None       Assessment/Plan SBO Hx of Crohn's - History of prior robotic ileocecectomy with SBR x2 in 2017  - Crohn's is currently followed by [facility], Dr. Shary Key. His last colonoscopy was on 02/13/2020.  I am unable to see the full report but per notes this showed active disease near the anastomosis.  At the time he was on Humira and had his dose increased with a follow-up MR enterography that showed no active disease.  He was eventually switched to the Stelara which is what he is currently on (last dose August)  - CT A/P w/ serial partial small bowel obstruction in the region of both small bowel anastomosis with moderate upstream dilation of several loops of small bowel. There as no superimposed focal enteric inflammatory change.  - Patient symptoms have now resolved.  He is nontender on exam and began passing flatus.  Feel we can hold off on NG tube and give SBO protocol orally. - Although there is no superimposed inflammatory changes noted on CT scan will ask GI to review scan to ensure he does not need any steroids etc   FEN - NPO, IVF VTE - SCDs, okay for chemical prophylaxis from general surgery standpoint ID - None  Jillyn Ledger, [facility] 02/02/2021, 8:56  AM Please see Amion for pager number during day hours 7:00am-4:30pm

## 2021-02-02 NOTE — Progress Notes (Signed)
PROGRESS NOTE   Anthony Reed  EZM:[id number]    DOB: 1985/09/26    DOA: 02/01/2021  PCP: Vivi Barrack, MD   I have briefly reviewed patients previous medical records in [facility].  Chief Complaint  Patient presents with   Abdominal Pain    Brief Narrative:  35 year old male with medical history significant for Crohn's disease, prior robotic ileocecectomy with small bowel resections x2 in 2017, is on Stelara, followed by Dr. Shary Key at [facility], presented with 1 day history of abdominal pain, nausea and vomiting.  He is admitted for small bowel obstruction.  General surgery consulted.  Managing conservatively.   Assessment & Plan:  Principal Problem:   Partial small bowel obstruction (HCC) Active Problems:   History of Crohn's disease   Small bowel obstruction: - Likely related to adhesive disease from prior surgeries for Crohn's disease - S/p robotic ileocecectomy with SBR x2 in 2017. - followed by [facility], Dr. Shary Key. His last colonoscopy was on 02/13/2020 - Currently on Stelara (last dose in August) - CT A/P 9/26: Serial partial small bowel obstruction in the region of both small bowel anastomosis with moderate upstream dilatation of several loops of small bowel.  No superimposed focal enteric inflammatory change.  Mild ascites. - Managed conservatively with n.p.o., IV fluids.  Not keen on NG tube and fortunately no further vomiting and did not need. - KUB 02/02/2021: Small bowel distention is slightly decreased from earlier study.  No evidence of high-grade small bowel obstruction. - General surgery input appreciated.  Getting small bowel protocol.  They are also asking GI to review scan to ensure he does not need any steroids. - General surgery have discussed with patient's primary GI and general surgeon in [facility] and hope this resolves with supportive care.  Crohn's disease - Patient on Stelara every 8 weeks.  Last dose was in August 2022. - CT shows no  acute inflammatory process but general surgery are having GI evaluate the CT report.  Body mass index is 27.89 kg/m.    DVT prophylaxis:   SCDs and early ambulation.   Code Status: Full Code Family Communication: None at bedside. Disposition:  Status is: Observation  The patient will require care spanning > 2 midnights and should be moved to inpatient because: Inpatient level of care appropriate due to severity of illness  Dispo: The patient is from: Home              Anticipated d/c is to: Home              Patient currently is not medically stable to d/c.   Difficult to place patient No        Consultants:   General surgery  Procedures:   None  Antimicrobials:    Anti-infectives (From admission, onward)    None         Subjective:  Reports feeling much better.  No abdominal pain since 3 AM.  Last BM yesterday morning.  Passing flatus.  No vomiting since yesterday.  Objective:   Vitals:   02/02/21 0300 02/02/21 0405 02/02/21 0604 02/02/21 0910  BP: 102/74 (!) 119/94 107/68 113/75  Pulse: (!) 54 (!) 51 (!) 50 (!) 54  Resp:  18 18 15   Temp:  98.5 F (36.9 C) (!) 97.2 F (36.2 C) 97.7 F (36.5 C)  TempSrc:  Oral Oral Oral  SpO2: 97% 100% 97% 99%  Weight:      Height:  General exam: Young male, well-built and nourished lying comfortably propped up in bed without distress.  Oral mucosa moist. Respiratory system: Clear to auscultation. Respiratory effort normal. Cardiovascular system: S1 & S2 heard, RRR. No JVD, murmurs, rubs, gallops or clicks. No pedal edema. Gastrointestinal system: Abdomen is nondistended, soft and nontender. No organomegaly or masses felt. Normal bowel sounds heard.  Small surgical scar from the left side of the umbilicus. Central nervous system: Alert and oriented. No focal neurological deficits. Extremities: Symmetric 5 x 5 power. Skin: No rashes, lesions or ulcers Psychiatry: Judgement and insight appear normal. Mood &  affect appropriate.     Data Reviewed:   I have personally reviewed following labs and imaging studies   CBC: Recent Labs  Lab 02/01/21 2013 02/02/21 0459  WBC 10.2 9.4  HGB 16.7 14.7  HCT 46.6 42.0  MCV 83.7 86.1  PLT 298 517    Basic Metabolic Panel: Recent Labs  Lab 02/01/21 2013 02/02/21 0459  NA 140 138  K 3.8 4.0  CL 103 105  CO2 25 28  GLUCOSE 90 97  BUN 19 17  CREATININE 1.25* 1.16  CALCIUM 10.2 8.8*    Liver Function Tests: Recent Labs  Lab 02/01/21 2013  AST 23  ALT 22  ALKPHOS 53  BILITOT 1.8*  PROT 7.1  ALBUMIN 4.6    CBG: No results for input(s): GLUCAP in the last 168 hours.  Microbiology Studies:   Recent Results (from the past 240 hour(s))  Resp Panel by RT-PCR (Flu A&B, Covid) Nasopharyngeal Swab     Status: None   Collection Time: 02/02/21  1:50 AM   Specimen: Nasopharyngeal Swab; Nasopharyngeal(NP) swabs in vial transport medium  Result Value Ref Range Status   SARS Coronavirus 2 by RT PCR NEGATIVE NEGATIVE Final    Comment: (NOTE) SARS-CoV-2 target nucleic acids are NOT DETECTED.  The SARS-CoV-2 RNA is generally detectable in upper respiratory specimens during the acute phase of infection. The lowest concentration of SARS-CoV-2 viral copies this assay can detect is 138 copies/mL. A negative result does not preclude SARS-Cov-2 infection and should not be used as the sole basis for treatment or other patient management decisions. A negative result may occur with  improper specimen collection/handling, submission of specimen other than nasopharyngeal swab, presence of viral mutation(s) within the areas targeted by this assay, and inadequate number of viral copies(<138 copies/mL). A negative result must be combined with clinical observations, patient history, and epidemiological information. The expected result is Negative.  Fact Sheet for Patients:  EntrepreneurPulse.com.au  Fact Sheet for Healthcare  Providers:  IncredibleEmployment.be  This test is no t yet approved or cleared by the [place] FDA and  has been authorized for detection and/or diagnosis of SARS-CoV-2 by FDA under an Emergency Use Authorization (EUA). This EUA will remain  in effect (meaning this test can be used) for the duration of the COVID-19 declaration under Section 564(b)(1) of the Act, 21 U.S.C.section 360bbb-3(b)(1), unless the authorization is terminated  or revoked sooner.       Influenza A by PCR NEGATIVE NEGATIVE Final   Influenza B by PCR NEGATIVE NEGATIVE Final    Comment: (NOTE) The Xpert Xpress SARS-CoV-2/FLU/RSV plus assay is intended as an aid in the diagnosis of influenza from Nasopharyngeal swab specimens and should not be used as a sole basis for treatment. Nasal washings and aspirates are unacceptable for Xpert Xpress SARS-CoV-2/FLU/RSV testing.  Fact Sheet for Patients: EntrepreneurPulse.com.au  Fact Sheet for Healthcare Providers: IncredibleEmployment.be  This  test is not yet approved or cleared by the [place] and has been authorized for detection and/or diagnosis of SARS-CoV-2 by FDA under an Emergency Use Authorization (EUA). This EUA will remain in effect (meaning this test can be used) for the duration of the COVID-19 declaration under Section 564(b)(1) of the Act, 21 U.S.C. section 360bbb-3(b)(1), unless the authorization is terminated or revoked.  Performed at [facility], [address] [place], [place] [address]      Radiology Studies:  CT ABDOMEN PELVIS W CONTRAST  Result Date: 02/02/2021 CLINICAL DATA:  Crohn's disease, abdominal pain and cramping, vomiting, bright red blood per rectum. EXAM: CT ABDOMEN AND PELVIS WITH CONTRAST TECHNIQUE: Multidetector CT imaging of the abdomen and pelvis was performed using the standard protocol following bolus administration of intravenous  contrast. CONTRAST:  66m OMNIPAQUE IOHEXOL 350 MG/ML SOLN COMPARISON:  None. FINDINGS: Lower chest: The visualized lung bases are clear. The visualized heart and pericardium are unremarkable. Hepatobiliary: No focal liver abnormality is seen. No gallstones, gallbladder wall thickening, or biliary dilatation. Pancreas: Unremarkable Spleen: Unremarkable Adrenals/Urinary Tract: Adrenal glands are unremarkable. Kidneys are normal, without renal calculi, focal lesion, or hydronephrosis. Bladder is unremarkable. Stomach/Bowel: Surgical changes of ileocolectomy as well as small bowel resection x2 has been performed. There are multiple dilated fluid-filled loops of small bowel identified proximal to both small bowel anastomosis in keeping with serial points of partial small bowel obstruction. Initial transition point is seen within the deep pelvis at axial image # 64/2 and sagittal image # 66/6. Subsequent stenotic segment is seen within the right lower quadrant at axial image # 52/2 and coronal image # 64/5. There is gas and stool seen throughout the colon in keeping with a partial serial small-bowel obstruction. Normal enhancement of the bowel. No evidence of focal inflammation. Mild ascites. No free intraperitoneal gas. Vascular/Lymphatic: The abdominal vasculature is unremarkable. No pathologic adenopathy within the abdomen and pelvis. Reproductive: Prostate is unremarkable. Other: Tiny bilateral fat containing inguinal hernia. The rectum is unremarkable. Musculoskeletal: No acute bone abnormality. No lytic or blastic bone lesion. IMPRESSION: Status post ileocolectomy and small-bowel resection x2. Serial partial small bowel obstruction in the region of both small bowel anastomosis with moderate upstream dilation of several loops of small bowel. No superimposed focal enteric inflammatory change. Mild ascites. Electronically Signed   By: AFidela SalisburyM.D.   On: 02/02/2021 00:58   DG Abd Portable 1V  Result Date:  02/02/2021 CLINICAL DATA:  Evaluate for small bowel obstruction. Abdominal pain. EXAM: PORTABLE ABDOMEN - 1 VIEW COMPARISON:  CT 02/02/2021 FINDINGS: Supine images of the abdomen pelvis demonstrate gas in the small and large bowel. Mildly dilated loops of small bowel in the left abdomen. Small bowel distension has slightly decreased from the CT tomogram on the same day. No large abdominopelvic calcifications. IMPRESSION: Small bowel distension has slightly decreased from the study earlier on 02/02/2021. No evidence for a high-grade small bowel obstruction. Electronically Signed   By: AMarkus DaftM.D.   On: 02/02/2021 08:52     Scheduled Meds:    Continuous Infusions:    sodium chloride 100 mL/hr at 02/02/21 1038     LOS: 0 days     AVernell Leep MD, FNew [place] SSus[facility]    To contact the attending provider between 7A-7P or the covering provider during after hours 7P-7A, please log into the web site [email or web address] and access using universal [place] password for that web site. If you do not have  the password, please call the hospital operator.  02/02/2021, 11:36 AM

## 2021-02-02 NOTE — Discharge Summary (Signed)
Physician Discharge Summary  Anthony Reed PRF:[id number] DOB: 04/02/1986  PCP: Anthony Barrack, MD  Admitted from: Home Discharged to: Home  Admit date: 02/01/2021 Discharge date: 02/02/2021  Recommendations for Outpatient Follow-up:    Follow-up Information     Anthony Barrack, MD Follow up in 1 week(s).   Specialty: Family Medicine Why: To be seen with repeat labs (CBC & BMP). Contact information: [place] [address] [phone]         Anthony Capers, MD. Schedule an appointment as soon as possible for a visit.   Specialty: Internal Medicine Why: Call for an appointment as soon as possible. Contact information: [address] [place] [address] [place] [address] [phone]         Anthony Cornfield, MD. Schedule an appointment as soon as possible for a visit.   Specialty: Colon and Rectal Surgery Why: Call for an appointment as soon as possible. Contact information: [place] [phone] [place]: None    Equipment/Devices: None    Discharge Condition: Improved and stable.   Code Status: Full Code Diet recommendation:  Discharge Diet Orders (From admission, onward)     Start     Ordered   02/02/21 0000  Diet full liquid       Comments: After you have tolerated full liquids for about 12 to 24 hours, you can advance to soft diet and after you have tolerated soft diet for about 1 to 2 days, you can advance to regular diet.   02/02/21 1926             Discharge Diagnoses:  Principal Problem:   Partial small bowel obstruction (HCC) Active Problems:   History of Crohn's disease   Brief Summary: 35 year old male with medical history significant for Crohn's disease, prior robotic ileocecectomy with small bowel resections x2 in 2017, is on Stelara, followed by Dr. Shary Reed at [facility], presented with 1 day history of abdominal pain, nausea  and vomiting.  He is admitted for small bowel obstruction.  General surgery consulted.  Managing conservatively.     Assessment & Plan:   Small bowel obstruction: - Likely related to adhesive disease from prior surgeries for Crohn's disease - S/p robotic ileocecectomy with SBR x2 in 2017. - followed by [facility], Dr. Shary Reed. His last colonoscopy was on 02/13/2020 - Currently on Stelara (last dose in August) and next dose is supposed to be in approximately 2 weeks.  Patient was advised to follow-up with his GI doctor regarding this. - CT A/P 9/26: Serial partial small bowel obstruction in the region of both small bowel anastomosis with moderate upstream dilatation of several loops of small bowel.  No superimposed focal enteric inflammatory change.  Mild ascites. - Managed conservatively with n.p.o., IV fluids.  Not keen on NG tube and fortunately no further vomiting and did not need. - KUB 02/02/2021: Small bowel distention is slightly decreased from earlier study.  No evidence of high-grade small bowel obstruction. - General surgery input appreciated.  They did a small bowel protocol.  He was started on clear liquid diet which he has tolerated.  He had 3 BMs since this morning, first 2 were mostly watery but the third 1 was large and had some stool in it.  Patient was insistent on being discharged this evening otherwise he threatened to leave [place]  ADVICE.  I personally called him and discussed at length.  He indicates that he feels back to his normal with no further abdominal pain, nausea, vomiting, having BMs and feels that he can manage himself at home.  I discussed with general surgery MD on-call who advised that the contrast on the small bowel protocol study had reached the colon, he could be advised to start full liquids, gradually advance diet and can be discharged home. - General surgery have discussed with patient's primary GI and general surgeon in [place] and hope this  resolves with supportive care. - Patient is advised to call his primary GI/surgeon's as soon as possible for a follow-up appointment and to seek immediate medical attention if there is any decline in his medical condition.  He verbalized understanding.   Crohn's disease - Patient on Stelara every 8 weeks.  Last dose was in August 2022. - CT shows no acute inflammatory process but general surgery are having GI evaluate the CT report.   Body mass index is 27.89 kg/m.           Consultants:   General surgery   Procedures:   None    Discharge Instructions  Discharge Instructions     Call MD for:  extreme fatigue   Complete by: As directed    Call MD for:  persistant dizziness or light-headedness   Complete by: As directed    Call MD for:  persistant nausea and vomiting   Complete by: As directed    Call MD for:  severe uncontrolled pain   Complete by: As directed    Diet full liquid   Complete by: As directed    After you have tolerated full liquids for about 12 to 24 hours, you can advance to soft diet and after you have tolerated soft diet for about 1 to 2 days, you can advance to regular diet.   Increase activity slowly   Complete by: As directed         Medication List     STOP taking these medications    VITAMIN C PO   VITAMIN D (CHOLECALCIFEROL) PO       TAKE these medications    ferrous sulfate 325 (65 FE) MG tablet Take 325 mg by mouth daily with breakfast.   FISH OIL PO Take 1 capsule by mouth daily.   loratadine 10 MG tablet Commonly known as: CLARITIN Take 10 mg by mouth daily as needed for itching, rhinitis or allergies.   PROBIOTIC PO Take 1 tablet by mouth daily.   Stelara 90 MG/ML Sosy injection Generic drug: ustekinumab INJECT 1 SYRINGE UNDER THE SKIN ONCE EVERY 8 WEEKS. REFRIGERATE. DO NOT FREEZE.   Thera Tabs Take by mouth.       No Known Allergies    Procedures/Studies: CT ABDOMEN PELVIS W CONTRAST  Result Date:  02/02/2021 CLINICAL DATA:  Crohn's disease, abdominal pain and cramping, vomiting, bright red blood per rectum. EXAM: CT ABDOMEN AND PELVIS WITH CONTRAST TECHNIQUE: Multidetector CT imaging of the abdomen and pelvis was performed using the standard protocol following bolus administration of intravenous contrast. CONTRAST:  70m OMNIPAQUE IOHEXOL 350 MG/ML SOLN COMPARISON:  None. FINDINGS: Lower chest: The visualized lung bases are clear. The visualized heart and pericardium are unremarkable. Hepatobiliary: No focal liver abnormality is seen. No gallstones, gallbladder wall thickening, or biliary dilatation. Pancreas: Unremarkable Spleen: Unremarkable Adrenals/Urinary Tract: Adrenal glands are unremarkable. Kidneys are normal, without renal calculi, focal lesion, or hydronephrosis. Bladder is unremarkable. Stomach/Bowel:  Surgical changes of ileocolectomy as well as small bowel resection x2 has been performed. There are multiple dilated fluid-filled loops of small bowel identified proximal to both small bowel anastomosis in keeping with serial points of partial small bowel obstruction. Initial transition point is seen within the deep pelvis at axial image # 64/2 and sagittal image # 66/6. Subsequent stenotic segment is seen within the right lower quadrant at axial image # 52/2 and coronal image # 64/5. There is gas and stool seen throughout the colon in keeping with a partial serial small-bowel obstruction. Normal enhancement of the bowel. No evidence of focal inflammation. Mild ascites. No free intraperitoneal gas. Vascular/Lymphatic: The abdominal vasculature is unremarkable. No pathologic adenopathy within the abdomen and pelvis. Reproductive: Prostate is unremarkable. Other: Tiny bilateral fat containing inguinal hernia. The rectum is unremarkable. Musculoskeletal: No acute bone abnormality. No lytic or blastic bone lesion. IMPRESSION: Status post ileocolectomy and small-bowel resection x2. Serial partial small  bowel obstruction in the region of both small bowel anastomosis with moderate upstream dilation of several loops of small bowel. No superimposed focal enteric inflammatory change. Mild ascites. Electronically Signed   By: Fidela Salisbury M.D.   On: 02/02/2021 00:58   DG Abd Portable 1V-Small Bowel Obstruction Protocol-initial, 8 hr delay  Result Date: 02/02/2021 CLINICAL DATA:  Small-bowel obstruction EXAM: PORTABLE ABDOMEN - 1 VIEW COMPARISON:  02/02/2021 FINDINGS: Supine frontal view of the abdomen and pelvis excluding the hemidiaphragms was obtained 8 hours after oral contrast administration. Oral contrast has progressed throughout the colon. No evidence of small-bowel obstruction or ileus. No masses or abnormal calcifications. IMPRESSION: 1. Oral contrast throughout the colon. No evidence of bowel obstruction or ileus. Electronically Signed   By: Randa Ngo M.D.   On: 02/02/2021 19:29   DG Abd Portable 1V  Result Date: 02/02/2021 CLINICAL DATA:  Evaluate for small bowel obstruction. Abdominal pain. EXAM: PORTABLE ABDOMEN - 1 VIEW COMPARISON:  CT 02/02/2021 FINDINGS: Supine images of the abdomen pelvis demonstrate gas in the small and large bowel. Mildly dilated loops of small bowel in the left abdomen. Small bowel distension has slightly decreased from the CT tomogram on the same day. No large abdominopelvic calcifications. IMPRESSION: Small bowel distension has slightly decreased from the study earlier on 02/02/2021. No evidence for a high-grade small bowel obstruction. Electronically Signed   By: Markus Daft M.D.   On: 02/02/2021 08:52      Subjective: Patient has had 3 BMs since this morning as noted above.  No abdominal pain, nausea or vomiting.  Tolerating clear liquid diet.  Discharge Exam:  Vitals:   02/02/21 0604 02/02/21 0910 02/02/21 1319 02/02/21 1831  BP: 107/68 113/75 116/77 116/75  Pulse: (!) 50 (!) 54 (!) 59 62  Resp: 18 15 16 16   Temp: (!) 97.2 F (36.2 C) 97.7 F (36.5  C) 97.7 F (36.5 C)   TempSrc: Oral Oral Oral   SpO2: 97% 99% 98% 100%  Weight:      Height:       General exam: Young male, well-built and nourished lying comfortably propped up in bed without distress.  Oral mucosa moist. Respiratory system: Clear to auscultation. Respiratory effort normal. Cardiovascular system: S1 & S2 heard, RRR. No JVD, murmurs, rubs, gallops or clicks. No pedal edema. Gastrointestinal system: Abdomen is nondistended, soft and nontender. No organomegaly or masses felt. Normal bowel sounds heard.  Small surgical scar from the left side of the umbilicus. Central nervous system: Alert and oriented. No focal neurological  deficits. Extremities: Symmetric 5 x 5 power. Skin: No rashes, lesions or ulcers Psychiatry: Judgement and insight appear normal. Mood & affect appropriate.     The results of significant diagnostics from this hospitalization (including imaging, microbiology, ancillary and laboratory) are listed below for reference.     Microbiology: Recent Results (from the past 240 hour(s))  Resp Panel by RT-PCR (Flu A&B, Covid) Nasopharyngeal Swab     Status: None   Collection Time: 02/02/21  1:50 AM   Specimen: Nasopharyngeal Swab; Nasopharyngeal(NP) swabs in vial transport medium  Result Value Ref Range Status   SARS Coronavirus 2 by RT PCR NEGATIVE NEGATIVE Final    Comment: (NOTE) SARS-CoV-2 target nucleic acids are NOT DETECTED.  The SARS-CoV-2 RNA is generally detectable in upper respiratory specimens during the acute phase of infection. The lowest concentration of SARS-CoV-2 viral copies this assay can detect is 138 copies/mL. A negative result does not preclude SARS-Cov-2 infection and should not be used as the sole basis for treatment or other patient management decisions. A negative result may occur with  improper specimen collection/handling, submission of specimen other than nasopharyngeal swab, presence of viral mutation(s) within the areas  targeted by this assay, and inadequate number of viral copies(<138 copies/mL). A negative result must be combined with clinical observations, patient history, and epidemiological information. The expected result is Negative.  Fact Sheet for Patients:  EntrepreneurPulse.com.au  Fact Sheet for Healthcare Providers:  IncredibleEmployment.be  This test is no t yet approved or cleared by the [place] FDA and  has been authorized for detection and/or diagnosis of SARS-CoV-2 by FDA under an Emergency Use Authorization (EUA). This EUA will remain  in effect (meaning this test can be used) for the duration of the COVID-19 declaration under Section 564(b)(1) of the Act, 21 U.S.C.section 360bbb-3(b)(1), unless the authorization is terminated  or revoked sooner.       Influenza A by PCR NEGATIVE NEGATIVE Final   Influenza B by PCR NEGATIVE NEGATIVE Final    Comment: (NOTE) The Xpert Xpress SARS-CoV-2/FLU/RSV plus assay is intended as an aid in the diagnosis of influenza from Nasopharyngeal swab specimens and should not be used as a sole basis for treatment. Nasal washings and aspirates are unacceptable for Xpert Xpress SARS-CoV-2/FLU/RSV testing.  Fact Sheet for Patients: EntrepreneurPulse.com.au  Fact Sheet for Healthcare Providers: IncredibleEmployment.be  This test is not yet approved or cleared by the [place] FDA and has been authorized for detection and/or diagnosis of SARS-CoV-2 by FDA under an Emergency Use Authorization (EUA). This EUA will remain in effect (meaning this test can be used) for the duration of the COVID-19 declaration under Section 564(b)(1) of the Act, 21 U.S.C. section 360bbb-3(b)(1), unless the authorization is terminated or revoked.  Performed at [facility], [address], [place], [place] [address]      Labs: CBC: Recent Labs  Lab 02/01/21 2013  02/02/21 0459  WBC 10.2 9.4  HGB 16.7 14.7  HCT 46.6 42.0  MCV 83.7 86.1  PLT 298 032    Basic Metabolic Panel: Recent Labs  Lab 02/01/21 2013 02/02/21 0459  NA 140 138  K 3.8 4.0  CL 103 105  CO2 25 28  GLUCOSE 90 97  BUN 19 17  CREATININE 1.25* 1.16  CALCIUM 10.2 8.8*    Liver Function Tests: Recent Labs  Lab 02/01/21 2013  AST 23  ALT 22  ALKPHOS 53  BILITOT 1.8*  PROT 7.1  ALBUMIN 4.6    Urinalysis  Component Value Date/Time   COLORURINE YELLOW 02/01/2021 2313   APPEARANCEUR CLEAR 02/01/2021 2313   LABSPEC 1.019 02/01/2021 2313   PHURINE 6.5 02/01/2021 2313   GLUCOSEU NEGATIVE 02/01/2021 2313   HGBUR NEGATIVE 02/01/2021 2313   BILIRUBINUR NEGATIVE 02/01/2021 2313   KETONESUR 15 (A) 02/01/2021 2313   PROTEINUR NEGATIVE 02/01/2021 2313   NITRITE NEGATIVE 02/01/2021 2313   LEUKOCYTESUR NEGATIVE 02/01/2021 2313      Time coordinating discharge: 25 minutes  SIGNED:  Vernell Leep, MD, [place], [facility]. [facility]  To contact the attending provider between 7A-7P or the covering provider during after hours 7P-7A, please log into the web site [email or web address] and access using universal [place] password for that web site. If you do not have the password, please call the hospital operator.

## 2021-02-02 NOTE — ED Provider Notes (Addendum)
Patient signed out pending CT imaging.  History of Crohn's disease and obstruction.  CT scans concerning for partial obstruction likely originating from the anastomotic sites of prior bowel resection.  On recheck, patient is awake, alert, continues to have some pain but is not vomiting.  His primary surgeon and gastroenterologist are within the [facility] system.  They do not have any available beds.  We will plan for admission for NG tube, fluids, pain control.  75 Spoke with admitting hospitalist.  Given that he is primarily seen at an outside hospital, will attempt to touch base with general surgery regarding this admission.  Regardless, given lack of capacity at outside hospital, he will need admission.  Consult placed for general surgery.  Physical Exam  BP 114/86   Pulse (!) 59   Temp 97.9 F (36.6 C) (Oral)   Resp 18   Ht 1.803 m (5' 11" )   Wt 90.7 kg   SpO2 98%   BMI 27.89 kg/m   Physical Exam  ED Course/Procedures     Procedures  MDM   Problem List Items Addressed This Visit   None Visit Diagnoses     SBO (small bowel obstruction) ([place])    -  Primary   History of Crohn's disease                 Hyrum Shaneyfelt, Barbette Hair, MD 02/02/21 0129    Merryl Hacker, MD 02/02/21 [phone]

## 2021-02-02 NOTE — Progress Notes (Signed)
Molli Posey to be D/C'd Home per MD order.  Discussed with the patient and all questions fully answered.  VSS, Skin clean, dry and intact without evidence of skin break down, no evidence of skin tears noted. IV catheter discontinued intact. Site without signs and symptoms of complications. Dressing and pressure applied.  An After Visit Summary was printed and given to the patient.   D/c education completed with patient including follow up instructions, medication list, d/c activities limitations if indicated, with other d/c instructions as indicated by MD - patient able to verbalize understanding, all questions fully answered.   Patient instructed to return to ED, call 911, or call MD for any changes in condition.   Patient escorted via tech and D/C home via private auto with wife.  Darnelle Catalan 02/02/2021 8:10 PM

## 2021-02-02 NOTE — Progress Notes (Signed)
Patient finished drinking gastrografin at 1050. X-ray notified to come in 8 hours.

## 2021-02-02 NOTE — H&P (Signed)
History and Physical    Anthony Reed FOY:[id number] DOB: 31-Aug-1985 DOA: 02/01/2021  PCP: Vivi Barrack, MD   Patient coming from: Home via [place]  Chief Complaint: Abdominal pain, nausea, vomiting  HPI: Anthony Reed is a 35 y.o. male with medical history significant for Crohn's disease with hx of bowel resection and SBO in past. Presents with abdominal pain associated with nausea and vomiting since last night at 5 PM.  Over a few hours.  The pain would intensify and he had increased nausea and vomiting.  Reports he had small amount of diarrhea with small amount of blood last night.  He has not had any further bleeding.  Reports he has not had any flatus.  He has not had any vomiting since arriving in the emergency room and nausea is being controlled with Zofran.  Abdominal pain is being controlled with Dilaudid.  He has not had any fevers, chills, cough, shortness of breath, chest pain, urinary symptoms.  He has not had any abdominal trauma.  States that the pain feels similar to when he has had small bowel obstructions in the past.   ED Course: Mr. Mcfarren has been hemodynamically stable in the emergency room.  CT scan of the abdomen revealed partial small bowel obstructions at the point of small bowel reanastomosis.  ER physician attempted to call [facility] for admission but they had no beds available so he was admitted to [facility].  ER physician paged the on-call surgeon for consultation but did not receive a call back.  We will call surgeon this morning to have them evaluate and consult this morning as patient has complex history of previous small bowel obstructions and bowel resection with his Crohn's disease.  Lab work was unremarkable.  COVID and influenza testing were negative.  Hospitalist service was asked to evaluate and manage patient  Review of Systems:  General: Denies fever, chills, weight loss, night sweats.  Denies dizziness.  HENT: Denies head trauma, headache,  denies change in hearing, tinnitus. Denies nasal congestion. Denies sore throat, Denies difficulty swallowing Eyes: Denies blurry vision, pain in eye, drainage.  Denies discoloration of eyes. Neck: Denies pain.  Denies swelling.  Denies pain with movement. Cardiovascular: Denies chest pain, palpitations.  Denies edema.  Denies orthopnea Respiratory: Denies shortness of breath, cough.  Denies wheezing.  Denies sputum production Gastrointestinal: Reports abdominal pain, swelling. Reports nausea, vomiting. Denies diarrhea. Denies melena. Denies hematemesis. Musculoskeletal: Denies limitation of movement. Denies deformity or swelling. Denies pain. Denies arthralgias or myalgias. Genitourinary: Denies pelvic pain.  Denies urinary frequency or hesitancy.  Denies dysuria.  Skin: Denies rash.  Denies petechiae, purpura, ecchymosis. Neurological:Denies syncope.Denies seizure activity. Denies slurred speech. Denies visual change. Psychiatric: Denies depression, anxiety. Denies hallucinations.  Past Medical History:  Diagnosis Date   Allergy    Crohn disease ([place])    Crohn disease ([place])    GERD (gastroesophageal reflux disease)    Wolff-Parkinson-White (WPW) pattern     Past Surgical History:  Procedure Laterality Date   BOWEL RESECTION  2016   Small bowel    Social History  reports that he quit smoking about 12 years ago. His smoking use included cigarettes. He has never used smokeless tobacco. He reports current alcohol use. He reports that he does not use drugs.  No Known Allergies  Family History  Problem Relation Age of Onset   Thyroid disease Mother    Asthma Father    Hyperlipidemia Father    Hypertension Father  Early death Maternal Grandfather    Cancer Maternal Grandfather 33   COPD Paternal Grandmother    Heart disease Paternal Grandfather    Hyperlipidemia Paternal Grandfather    Hypertension Paternal Grandfather    Kidney disease Paternal Grandfather    Asthma Sister     Colon cancer Neg Hx    Prostate cancer Neg Hx      Prior to Admission medications   Medication Sig Start Date End Date Taking? Authorizing Provider  ustekinumab (STELARA) 90 MG/ML SOSY injection INJECT 1 SYRINGE UNDER THE SKIN ONCE EVERY 8 WEEKS. REFRIGERATE. DO NOT FREEZE. 03/19/20  Yes [provider]  Ascorbic Acid (VITAMIN C PO) Take 1 tablet by mouth daily.    [provider]  ferrous sulfate 325 (65 FE) MG tablet Take 325 mg by mouth daily with breakfast.    [provider]  loratadine (CLARITIN) 10 MG tablet Take by mouth.    [provider]  Multiple Vitamin (THERA) TABS Take by mouth.    [provider]  Omega-3 Fatty Acids (FISH OIL PO) Take 1 capsule by mouth daily.    [provider]  Probiotic Product (PROBIOTIC PO) Take 1 tablet by mouth daily.    [provider]  VITAMIN D, CHOLECALCIFEROL, PO Take by mouth.    [provider]    Physical Exam: Vitals:   02/02/21 0000 02/02/21 0157 02/02/21 0300 02/02/21 0405  BP: 114/86 103/86 102/74 (!) 119/94  Pulse: (!) 59 (!) 51 (!) 54 (!) 51  Resp: 18 20  18   Temp:    98.5 F (36.9 C)  TempSrc:    Oral  SpO2: 98% 98% 97% 100%  Weight:      Height:        Constitutional: NAD, calm, comfortable Vitals:   02/02/21 0000 02/02/21 0157 02/02/21 0300 02/02/21 0405  BP: 114/86 103/86 102/74 (!) 119/94  Pulse: (!) 59 (!) 51 (!) 54 (!) 51  Resp: 18 20  18   Temp:    98.5 F (36.9 C)  TempSrc:    Oral  SpO2: 98% 98% 97% 100%  Weight:      Height:       General: WDWN, Alert and oriented x3.  Eyes: EOMI, PERRL, conjunctivae normal.  Sclera nonicteric HENT:  [place]/AT, external ears normal.  Nares patent without epistasis.  Mucous membranes are dry Neck: Soft, normal range of motion, supple, no masses, Trachea midline Respiratory: clear to auscultation bilaterally, no wheezing, no crackles. Normal respiratory effort. No accessory muscle use.  Cardiovascular:  Regular rate and rhythm, no murmurs / rubs / gallops. No extremity edema.  Abdomen: Soft, mild diffuse tenderness, nondistended, no rebound or guarding.  No masses palpated. No hepatosplenomegaly. Bowel sounds hypoactive Musculoskeletal: FROM. no cyanosis. No joint deformity upper and lower extremities. Normal muscle tone.  Skin: Warm, dry, intact no rashes, lesions, ulcers. No induration Neurologic: CN 2-12 grossly intact.  Normal speech.  Sensation intact. Strength 5/5 in all extremities.   Psychiatric: Normal judgment and insight. Normal mood.    Labs on Admission: I have personally reviewed following labs and imaging studies  CBC: Recent Labs  Lab 02/01/21 2013  WBC 10.2  HGB 16.7  HCT 46.6  MCV 83.7  PLT 527    Basic Metabolic Panel: Recent Labs  Lab 02/01/21 2013  NA 140  K 3.8  CL 103  CO2 25  GLUCOSE 90  BUN 19  CREATININE 1.25*  CALCIUM 10.2    GFR: Estimated Creatinine  Clearance: 94.2 mL/min (A) (by C-G formula based on SCr of 1.25 mg/dL (H)).  Liver Function Tests: Recent Labs  Lab 02/01/21 2013  AST 23  ALT 22  ALKPHOS 53  BILITOT 1.8*  PROT 7.1  ALBUMIN 4.6    Urine analysis:    Component Value Date/Time   COLORURINE YELLOW 02/01/2021 2313   APPEARANCEUR CLEAR 02/01/2021 2313   LABSPEC 1.019 02/01/2021 2313   PHURINE 6.5 02/01/2021 2313   GLUCOSEU NEGATIVE 02/01/2021 2313   HGBUR NEGATIVE 02/01/2021 2313   BILIRUBINUR NEGATIVE 02/01/2021 2313   KETONESUR 15 (A) 02/01/2021 2313   PROTEINUR NEGATIVE 02/01/2021 2313   NITRITE NEGATIVE 02/01/2021 2313   LEUKOCYTESUR NEGATIVE 02/01/2021 2313    Radiological Exams on Admission: CT ABDOMEN PELVIS W CONTRAST  Result Date: 02/02/2021 CLINICAL DATA:  Crohn's disease, abdominal pain and cramping, vomiting, bright red blood per rectum. EXAM: CT ABDOMEN AND PELVIS WITH CONTRAST TECHNIQUE: Multidetector CT imaging of the abdomen and pelvis was performed using the standard protocol following bolus  administration of intravenous contrast. CONTRAST:  12m OMNIPAQUE IOHEXOL 350 MG/ML SOLN COMPARISON:  None. FINDINGS: Lower chest: The visualized lung bases are clear. The visualized heart and pericardium are unremarkable. Hepatobiliary: No focal liver abnormality is seen. No gallstones, gallbladder wall thickening, or biliary dilatation. Pancreas: Unremarkable Spleen: Unremarkable Adrenals/Urinary Tract: Adrenal glands are unremarkable. Kidneys are normal, without renal calculi, focal lesion, or hydronephrosis. Bladder is unremarkable. Stomach/Bowel: Surgical changes of ileocolectomy as well as small bowel resection x2 has been performed. There are multiple dilated fluid-filled loops of small bowel identified proximal to both small bowel anastomosis in keeping with serial points of partial small bowel obstruction. Initial transition point is seen within the deep pelvis at axial image # 64/2 and sagittal image # 66/6. Subsequent stenotic segment is seen within the right lower quadrant at axial image # 52/2 and coronal image # 64/5. There is gas and stool seen throughout the colon in keeping with a partial serial small-bowel obstruction. Normal enhancement of the bowel. No evidence of focal inflammation. Mild ascites. No free intraperitoneal gas. Vascular/Lymphatic: The abdominal vasculature is unremarkable. No pathologic adenopathy within the abdomen and pelvis. Reproductive: Prostate is unremarkable. Other: Tiny bilateral fat containing inguinal hernia. The rectum is unremarkable. Musculoskeletal: No acute bone abnormality. No lytic or blastic bone lesion. IMPRESSION: Status post ileocolectomy and small-bowel resection x2. Serial partial small bowel obstruction in the region of both small bowel anastomosis with moderate upstream dilation of several loops of small bowel. No superimposed focal enteric inflammatory change. Mild ascites. Electronically Signed   By: AFidela SalisburyM.D.   On: 02/02/2021 00:58      Assessment/PlanPrincipal Problem:   Partial small bowel obstruction  Mr. OWilldenis placed on Med-Surg for observation.  Patient is given Dilaudid for pain control and Zofran for antiemetic. Will consult surgery to evaluate patient in the morning as he has had previous small bowel obstructions with his Crohn's disease after bowel resections.  Patient refused to have NG tube placed in the emergency room and does not want if it is not absolutely necessary.  At this point he is not having any vomiting and nausea is being well controlled with Zofran.  I explained to patient that if the starts to have vomiting or becomes distended and pain increases then we will need to place NG tube which at that point he would be agreeable to. IV fluid hydration with normal saline at 100 ml/hr [facility] but no [facility] available at this time.   Active Problems:   History of Crohn's disease Followed by GI at [facility].    DVT prophylaxis: Padua score low. Early ambulation for DVT prophylaxis.   Code Status:   Full Code  Family Communication:  Diagnosis and plan discussed with patient.  Patient verbalized understanding agrees with plan.  Further recommendations to follow as clinical indicated Disposition Plan:   Patient is from:  Home  Anticipated DC to:  Home  Anticipated DC date:  Anticipate less than 2 midnight stay but this could change depending on if symptoms don't improve   Consults called:  Surgery, Dr. Marlou Starks  Admission status:  Observation    Yevonne Aline Chanler Mendonca MD [facility]  How to contact the [facility] Attending or Consulting provider [place] or covering provider during after hours [place], for this patient?   Check the care team in [facility] and look for a) attending/consulting [facility] provider listed and b) the [facility] team listed Log into [email or web address] and use [place]'s universal password to access. If you do not have the password, please contact  the hospital operator. Locate the [facility] provider you are looking for under [facility] and page to a number that you can be directly reached. If you still have difficulty reaching the provider, please page the [facility] (Director on Call) for the Hospitalists listed on amion for assistance.  02/02/2021, 4:52 AM

## 2021-02-09 ENCOUNTER — Telehealth: Payer: Self-pay | Admitting: *Deleted

## 2021-02-09 NOTE — Telephone Encounter (Signed)
Transition Care Management Unsuccessful Follow-up Telephone Call  Date of discharge and from where:  02/02/21 from [facility]  Attempts:  1st Attempt  Reason for unsuccessful TCM follow-up call:  Left voice message  Kelli Churn RN, CCM, [place] Management Coordinator  [phone]

## 2021-02-12 ENCOUNTER — Telehealth: Payer: Self-pay

## 2021-02-12 NOTE — Telephone Encounter (Signed)
Transition Care Management Unsuccessful Follow-up Telephone Call  Date of discharge and from where:  02/02/21 from [facility]  Attempts:  2nd Attempt  Reason for unsuccessful TCM follow-up call:  Left voice message   Thea Silversmith, RN, MSN, BSN, [place] Care Management Coordinator [phone]

## 2021-02-14 ENCOUNTER — Other Ambulatory Visit: Payer: Self-pay

## 2021-02-14 ENCOUNTER — Observation Stay (HOSPITAL_COMMUNITY)
Admission: EM | Admit: 2021-02-14 | Discharge: 2021-02-15 | Disposition: A | Discharge status: Home / Self Care | Payer: 59 | Attending: Emergency Medicine | Admitting: Emergency Medicine

## 2021-02-14 ENCOUNTER — Encounter (HOSPITAL_COMMUNITY): Payer: Self-pay

## 2021-02-14 ENCOUNTER — Emergency Department (HOSPITAL_COMMUNITY): Payer: 59

## 2021-02-14 DIAGNOSIS — K56600 Partial intestinal obstruction, unspecified as to cause: Principal | ICD-10-CM | POA: Diagnosis present

## 2021-02-14 DIAGNOSIS — Z20822 Contact with and (suspected) exposure to covid-19: Secondary | ICD-10-CM | POA: Diagnosis not present

## 2021-02-14 DIAGNOSIS — E860 Dehydration: Secondary | ICD-10-CM | POA: Diagnosis present

## 2021-02-14 DIAGNOSIS — R1084 Generalized abdominal pain: Secondary | ICD-10-CM | POA: Diagnosis present

## 2021-02-14 DIAGNOSIS — K219 Gastro-esophageal reflux disease without esophagitis: Secondary | ICD-10-CM | POA: Diagnosis present

## 2021-02-14 DIAGNOSIS — I456 Pre-excitation syndrome: Secondary | ICD-10-CM | POA: Diagnosis present

## 2021-02-14 DIAGNOSIS — Z87891 Personal history of nicotine dependence: Secondary | ICD-10-CM | POA: Diagnosis not present

## 2021-02-14 DIAGNOSIS — K56609 Unspecified intestinal obstruction, unspecified as to partial versus complete obstruction: Secondary | ICD-10-CM

## 2021-02-14 DIAGNOSIS — Z8719 Personal history of other diseases of the digestive system: Secondary | ICD-10-CM

## 2021-02-14 LAB — URINALYSIS, ROUTINE W REFLEX MICROSCOPIC
Bilirubin Urine: NEGATIVE
Glucose, UA: NEGATIVE mg/dL
Hgb urine dipstick: NEGATIVE
Ketones, ur: 20 mg/dL — AB
Leukocytes,Ua: NEGATIVE
Nitrite: NEGATIVE
Protein, ur: NEGATIVE mg/dL
Specific Gravity, Urine: >1.046 — ABNORMAL HIGH (ref 1.005–1.030)
pH: 8 (ref 5.0–8.0)

## 2021-02-14 LAB — COMPREHENSIVE METABOLIC PANEL
ALT: 43 U/L (ref 0–44)
AST: 45 U/L — ABNORMAL HIGH (ref 15–41)
Albumin: 5.2 g/dL — ABNORMAL HIGH (ref 3.5–5.0)
Alkaline Phosphatase: 76 U/L (ref 38–126)
Anion gap: 15 (ref 5–15)
BUN: 17 mg/dL (ref 6–20)
CO2: 23 mmol/L (ref 22–32)
Calcium: 10 mg/dL (ref 8.9–10.3)
Chloride: 101 mmol/L (ref 98–111)
Creatinine, Ser: 1.48 mg/dL — ABNORMAL HIGH (ref 0.61–1.24)
GFR, Estimated: >60 mL/min (ref >60)
Glucose, Bld: 134 mg/dL — ABNORMAL HIGH (ref 70–99)
Potassium: 3.8 mmol/L (ref 3.5–5.1)
Sodium: 139 mmol/L (ref 135–145)
Total Bilirubin: 3.1 mg/dL — ABNORMAL HIGH (ref 0.3–1.2)
Total Protein: 8.4 g/dL — ABNORMAL HIGH (ref 6.5–8.1)

## 2021-02-14 LAB — RESP PANEL BY RT-PCR (FLU A&B, COVID) ARPGX2
Influenza A by PCR: NEGATIVE
Influenza B by PCR: NEGATIVE
SARS Coronavirus 2 by RT PCR: NEGATIVE

## 2021-02-14 LAB — CBC
HCT: 51.3 % (ref 39.0–52.0)
Hemoglobin: 18.4 g/dL — ABNORMAL HIGH (ref 13.0–17.0)
MCH: 30.2 pg (ref 26.0–34.0)
MCHC: 35.9 g/dL (ref 30.0–36.0)
MCV: 84.2 fL (ref 80.0–100.0)
Platelets: 379 10*3/uL (ref 150–400)
RBC: 6.09 MIL/uL — ABNORMAL HIGH (ref 4.22–5.81)
RDW: 12.3 % (ref 11.5–15.5)
WBC: 14.9 10*3/uL — ABNORMAL HIGH (ref 4.0–10.5)
nRBC: 0 % (ref 0.0–0.2)

## 2021-02-14 LAB — MAGNESIUM: Magnesium: 1.9 mg/dL (ref 1.7–2.4)

## 2021-02-14 LAB — LIPASE, BLOOD: Lipase: 32 U/L (ref 11–51)

## 2021-02-14 MED ORDER — LACTATED RINGERS IV BOLUS
1000.0000 mL | Freq: Once | INTRAVENOUS | Status: AC
  Administered 2021-02-14: 1000 mL via INTRAVENOUS

## 2021-02-14 MED ORDER — MORPHINE SULFATE (PF) 4 MG/ML IV SOLN
4.0000 mg | Freq: Once | INTRAVENOUS | Status: AC
Start: 2021-02-14 — End: 2021-02-14
  Administered 2021-02-14: 4 mg via INTRAVENOUS
  Filled 2021-02-14: qty 1

## 2021-02-14 MED ORDER — ONDANSETRON HCL 4 MG PO TABS: 4.0000 mg | ORAL_TABLET | Freq: Four times a day (QID) | ORAL | Status: DC | PRN

## 2021-02-14 MED ORDER — ONDANSETRON HCL 4 MG/2ML IJ SOLN
4.0000 mg | Freq: Once | INTRAMUSCULAR | Status: AC
  Administered 2021-02-14: 4 mg via INTRAVENOUS
  Filled 2021-02-14: qty 2

## 2021-02-14 MED ORDER — ENOXAPARIN SODIUM 40 MG/0.4ML IJ SOSY
40.0000 mg | PREFILLED_SYRINGE | INTRAMUSCULAR | Status: DC
  Administered 2021-02-14: 40 mg via SUBCUTANEOUS

## 2021-02-14 MED ORDER — LACTATED RINGERS IV BOLUS
2000.0000 mL | Freq: Once | INTRAVENOUS | Status: AC
  Administered 2021-02-14: 2000 mL via INTRAVENOUS

## 2021-02-14 MED ORDER — IOHEXOL 350 MG/ML SOLN
75.0000 mL | Freq: Once | INTRAVENOUS | Status: AC | PRN
  Administered 2021-02-14: 75 mL via INTRAVENOUS

## 2021-02-14 MED ORDER — ACETAMINOPHEN 325 MG PO TABS: 650.0000 mg | ORAL_TABLET | Freq: Four times a day (QID) | ORAL | Status: DC | PRN

## 2021-02-14 MED ORDER — METHYLPREDNISOLONE SODIUM SUCC 40 MG IJ SOLR
40.0000 mg | Freq: Every day | INTRAMUSCULAR | Status: DC
  Administered 2021-02-14 – 2021-02-15 (×2): 40 mg via INTRAVENOUS
  Filled 2021-02-14 (×2): qty 1

## 2021-02-14 MED ORDER — ACETAMINOPHEN 650 MG RE SUPP: 650.0000 mg | Freq: Four times a day (QID) | RECTAL | Status: DC | PRN

## 2021-02-14 MED ORDER — SODIUM CHLORIDE 0.9 % IV BOLUS
1000.0000 mL | Freq: Once | INTRAVENOUS | Status: AC
  Administered 2021-02-14: 1000 mL via INTRAVENOUS

## 2021-02-14 MED ORDER — PANTOPRAZOLE SODIUM 40 MG IV SOLR
40.0000 mg | INTRAVENOUS | Status: DC
  Administered 2021-02-14: 40 mg via INTRAVENOUS
  Filled 2021-02-14: qty 40

## 2021-02-14 MED ORDER — HYDROMORPHONE HCL 1 MG/ML IJ SOLN
1.0000 mg | INTRAMUSCULAR | Status: DC | PRN
  Administered 2021-02-14 (×2): 1 mg via INTRAVENOUS
  Filled 2021-02-14 (×2): qty 1

## 2021-02-14 MED ORDER — ONDANSETRON HCL 4 MG/2ML IJ SOLN: 4.0000 mg | Freq: Four times a day (QID) | INTRAMUSCULAR | Status: DC | PRN

## 2021-02-14 NOTE — ED Provider Notes (Addendum)
Anthony Reed   CSN: [id number] Arrival date & time: 02/14/21  1048     History Chief Complaint  Patient presents with   Abdominal Pain    Anthony Reed is a 35 y.o. male.  Patient has a past medical history of Crohn's disease, multiple small bowel surgeries, multiple partial small bowel obstructions. Patient presents with abdominal pain that started this morning around 5 AM and is gradually worsened since then.  Pain is generalized and mostly in the center of stomach.  Patient has had multiple bowel obstructions and this feels identical to this.  He started having vomiting around 7 AM this morning that has been continuous.  His last bowel movement was this morning but it was very very small.  He was recently admitted to the hospital for small bowel obstruction last week.  He was discharged on Monday, September 25th.  At that time, he was to follow-up with his GI doctor which she has an appointment this week for.  They will possibly repeat surgery to try to diminish some of the scar tissue that is causing his obstructions.  He has been having more more bowel obstructions and has been admitted multiple times this year.  He denies any chest pain, shortness of breath.   Abdominal Pain Associated symptoms: nausea and vomiting   Associated symptoms: no chest pain, no chills, no constipation, no cough, no diarrhea, no dysuria, no fever, no hematuria, no shortness of breath and no sore throat       Past Medical History:  Diagnosis Date   Allergy    Crohn disease ([place])    Crohn disease ([place])    GERD (gastroesophageal reflux disease)    Wolff-Parkinson-White (WPW) pattern     Patient Active Problem List   Diagnosis Date Noted   Partial small bowel obstruction ([place]) 02/02/2021   History of Crohn's disease 02/02/2021   WPW (Wolff-Parkinson-White syndrome) 01/06/2021   Snoring 01/06/2021   Excessive daytime sleepiness 01/06/2021    Sleep-disordered breathing 12/22/2020   Crohn's disease with complication ([place]) 15/40/0867   Wolff-Parkinson-White (WPW) pattern     Past Surgical History:  Procedure Laterality Date   BOWEL RESECTION  2016   Small bowel       Family History  Problem Relation Age of Onset   Thyroid disease Mother    Asthma Father    Hyperlipidemia Father    Hypertension Father    Early death Maternal Grandfather    Cancer Maternal Grandfather 2   COPD Paternal Grandmother    Heart disease Paternal Grandfather    Hyperlipidemia Paternal Grandfather    Hypertension Paternal Grandfather    Kidney disease Paternal Grandfather    Asthma Sister    Colon cancer Neg Hx    Prostate cancer Neg Hx     Social History   Tobacco Use   Smoking status: Former    Types: Cigarettes    Quit date: 12/11/2008    Years since quitting: 12.1   Smokeless tobacco: Never  Vaping Use   Vaping Use: Never used  Substance Use Topics   Alcohol use: Yes    Alcohol/week: 0.0 standard drinks   Drug use: Never    Home Medications Prior to Admission medications   Medication Sig Start Date End Date Taking? Authorizing Provider  ferrous sulfate 325 (65 FE) MG tablet Take 325 mg by mouth daily with breakfast.   Yes [provider]  loratadine (CLARITIN) 10 MG tablet Take 10 mg by  mouth daily as needed for itching, rhinitis or allergies.   Yes [provider]  Multiple Vitamin (THERA) TABS Take 1 tablet by mouth daily.   Yes [provider]  Omega-3 Fatty Acids (FISH OIL PO) Take 1 capsule by mouth daily.   Yes [provider]  Probiotic Product (PROBIOTIC PO) Take 1 tablet by mouth daily.   Yes [provider]  ustekinumab (STELARA) 90 MG/ML SOSY injection Inject 90 mg into the skin See admin instructions. INJECT 1 SYRINGE UNDER THE SKIN ONCE EVERY 8 WEEKS. REFRIGERATE. DO NOT FREEZE. 03/19/20  Yes [provider]    Allergies    Patient has no known  allergies.  Review of Systems   Review of Systems  Constitutional:  Negative for chills and fever.  HENT:  Negative for congestion, rhinorrhea and sore throat.   Eyes:  Negative for visual disturbance.  Respiratory:  Negative for cough, chest tightness and shortness of breath.   Cardiovascular:  Negative for chest pain, palpitations and leg swelling.  Gastrointestinal:  Positive for abdominal pain, nausea and vomiting. Negative for blood in stool, constipation and diarrhea.  Genitourinary:  Negative for dysuria, flank pain and hematuria.  Musculoskeletal:  Negative for back pain.  Skin:  Negative for rash and wound.  Neurological:  Negative for dizziness, syncope, weakness, light-headedness and headaches.  Psychiatric/Behavioral:  Negative for confusion.   All other systems reviewed and are negative.  Physical Exam Updated Vital Signs BP (!) 143/94   Pulse 62   Temp 97.7 F (36.5 C) (Oral)   Resp 16   Ht 5' 11"  (1.803 m)   Wt 90.7 kg   SpO2 100%   BMI 27.89 kg/m   Physical Exam Vitals and nursing Reed reviewed.  Constitutional:      General: He is not in acute distress.    Appearance: Normal appearance. He is not ill-appearing, toxic-appearing or diaphoretic.  HENT:     Head: Normocephalic and atraumatic.     Nose: No nasal deformity.     Mouth/Throat:     Lips: Pink. No lesions.     Mouth: Mucous membranes are dry. No injury, lacerations, oral lesions or angioedema.     Pharynx: Oropharynx is clear. Uvula midline. No pharyngeal swelling, oropharyngeal exudate, posterior oropharyngeal erythema or uvula swelling.  Eyes:     General: Gaze aligned appropriately. No scleral icterus.       Right eye: No discharge.        Left eye: No discharge.     Conjunctiva/sclera: Conjunctivae normal.     Right eye: Right conjunctiva is not injected. No exudate or hemorrhage.    Left eye: Left conjunctiva is not injected. No exudate or hemorrhage.    Pupils: Pupils are equal, round,  and reactive to light.  Cardiovascular:     Rate and Rhythm: Normal rate and regular rhythm.     Pulses: Normal pulses.          Radial pulses are 2+ on the right side and 2+ on the left side.       Dorsalis pedis pulses are 2+ on the right side and 2+ on the left side.     Heart sounds: Normal heart sounds, S1 normal and S2 normal. Heart sounds not distant. No murmur heard.   No friction rub. No gallop. No S3 or S4 sounds.  Pulmonary:     Effort: Respiratory distress present. No accessory muscle usage.     Breath sounds: Normal breath sounds. No  stridor. No wheezing, rhonchi or rales.     Comments: Patient is very dyspneic with breathing likely secondary to pain. Chest:     Chest wall: No tenderness.  Abdominal:     General: Abdomen is flat. Bowel sounds are normal. There is no distension.     Palpations: Abdomen is soft. There is no mass or pulsatile mass.     Tenderness: There is generalized abdominal tenderness. There is no right CVA tenderness, left CVA tenderness, guarding or rebound.  Musculoskeletal:     Right lower leg: No edema.     Left lower leg: No edema.  Skin:    General: Skin is warm and dry.     Coloration: Skin is not jaundiced or pale.     Findings: No bruising, erythema, lesion or rash.  Neurological:     General: No focal deficit present.     Mental Status: He is alert and oriented to person, place, and time.     GCS: GCS eye subscore is 4. GCS verbal subscore is 5. GCS motor subscore is 6.  Psychiatric:        Mood and Affect: Mood normal.        Behavior: Behavior normal. Behavior is cooperative.    ED Results / Procedures / Treatments   Labs (all labs ordered are listed, but only abnormal results are displayed) Labs Reviewed  COMPREHENSIVE METABOLIC PANEL - Abnormal; Notable for the following components:      Result Value   Glucose, Bld 134 (*)    Creatinine, Ser 1.48 (*)    Total Protein 8.4 (*)    Albumin 5.2 (*)    AST 45 (*)    Total Bilirubin  3.1 (*)    All other components within normal limits  CBC - Abnormal; Notable for the following components:   WBC 14.9 (*)    RBC 6.09 (*)    Hemoglobin 18.4 (*)    All other components within normal limits  LIPASE, BLOOD  URINALYSIS, ROUTINE W REFLEX MICROSCOPIC    EKG None  Radiology CT Abdomen Pelvis W Contrast  Addendum Date: 02/14/2021   ADDENDUM REPORT: 02/14/2021 13:32 ADDENDUM: The radiopaque foreign body identified within a small bowel loop in the RIGHT pelvis was present in the lower central pelvis on the prior study of 02/02/2021. This is of uncertain etiology but is not causing obstruction. Electronically Signed   By: Lavonia Dana M.D.   On: 02/14/2021 13:32   Result Date: 02/14/2021 CLINICAL DATA:  Crohn's disease, hospitalized 2 weeks ago for small-bowel obstruction, possible adhesions, suspected small-bowel obstruction EXAM: CT ABDOMEN AND PELVIS WITH CONTRAST TECHNIQUE: Multidetector CT imaging of the abdomen and pelvis was performed using the standard protocol following bolus administration of intravenous contrast. Sagittal and coronal MPR images reconstructed from axial data set. CONTRAST:  39m OMNIPAQUE IOHEXOL 350 MG/ML SOLN IV. No oral contrast. COMPARISON:  02/02/2021 FINDINGS: Lower chest: Lung bases clear Hepatobiliary: Gallbladder and liver normal appearance Pancreas: Normal appearance Spleen: Normal appearance Adrenals/Urinary Tract: Adrenal glands, kidneys, ureters, and bladder normal appearance Stomach/Bowel: Prior ileo-cecectomy with anastomosis. Scattered fluid within colon. A small bowel loop in the RIGHT lower quadrant demonstrates mild wall thickening suspicious for active Crohn's disease. Proximal small bowel loops are upper normal caliber to mildly dilated, similar to previous exam. Staple lines from prior small-bowel resections with anastomoses. The more dilated segments are proximal to the observed anastomoses. Findings likely represent partial small bowel  obstruction. Ovoid radiopacity 15 x 10 x 14  mm identified within a small bowel loop in the RIGHT pelvis, consistent with ingested foreign body. Vascular/Lymphatic: Vascular structures patent.  No adenopathy. Reproductive: Unremarkable prostate gland and seminal vesicles Other: No free air or free fluid. Small RIGHT inguinal hernia containing fat. Musculoskeletal: Unremarkable IMPRESSION: Prior ileo-cecectomy with anastomosis. Proximal small bowel loops are upper normal caliber to mildly dilated, similar to previous exam. Mild wall thickening of a small bowel loop in the RIGHT lower quadrant suspicious for active Crohn's disease. Ovoid radiopacity within a small bowel loop in the RIGHT pelvis 15 x 10 x 14 mm consistent with ingested foreign body. Small RIGHT inguinal hernia containing fat. Electronically Signed: By: Lavonia Dana M.D. On: 02/14/2021 12:50    Procedures Procedures   Medications Ordered in ED Medications  methylPREDNISolone sodium succinate (SOLU-MEDROL) 40 mg/mL injection 40 mg (has no administration in time range)  enoxaparin (LOVENOX) injection 40 mg (has no administration in time range)  lactated ringers bolus 2,000 mL (has no administration in time range)  acetaminophen (TYLENOL) tablet 650 mg (has no administration in time range)    Or  acetaminophen (TYLENOL) suppository 650 mg (has no administration in time range)  ondansetron (ZOFRAN) tablet 4 mg (has no administration in time range)    Or  ondansetron (ZOFRAN) injection 4 mg (has no administration in time range)  sodium chloride 0.9 % bolus 1,000 mL (1,000 mLs Intravenous Bolus 02/14/21 1151)  morphine 4 MG/ML injection 4 mg (4 mg Intravenous Given 02/14/21 1150)  ondansetron (ZOFRAN) injection 4 mg (4 mg Intravenous Given 02/14/21 1150)  iohexol (OMNIPAQUE) 350 MG/ML injection 75 mL (75 mLs Intravenous Contrast Given 02/14/21 1213)    ED Course  I have reviewed the triage vital signs and the nursing notes.  Pertinent labs  & imaging results that were available during my care of the patient were reviewed by me and considered in my medical decision making (see chart for details).  Clinical Course as of 02/15/21 2878  Sat Feb 14, 2021  1445 Spoke with general surgery, Dr. Ferd Glassing. At this time, recommend consulting with GI due to Chrons diagnosis.  [GL]    Clinical Course User Index [GL] Garry Bochicchio, Adora Fridge, PA-C   MDM Rules/Calculators/A&P                         This is an ill-appearing 35 year old male presents the emergency department with complaints of generalized Abdominal pain that started 4 hours ago is gradually worsened and nausea and vomiting.  He has a history of small bowel obstructions due to previous surgeries and he was recently discharged in the hospital for a small bowel obstruction. He is afebrile and his vitals are stable. Patient does look to be in distress and is dyspneic breathing due to pain.  Patient states that his symptoms are identical to his previous obstructions. I suspect that patient's presentation is due to a recurrent small bowel obstruction, however early appendicitis, pancreatitis, or other intraabdominal etiology cannot be excluded.   Labs are pending. I ordered CT abdomen pelvis with contrast.  We will treat nausea and pain.  Giving 1 L normal saline fluid bolus.  Will need to remain n.p.o.  Labs with mild leukocytosis to 14.9.  He also has a elevated creatinine 1.48 consistent with mild AKI.  CT scan was consistent with partial bowel obstruction.  I spoke with Dr. Thornton Papas at 1330 from radiology regarding patient's results.  He clarified that the radiopaque area found was stable  from previous CT scan and was not related to patient's obstruction.  On reevaluation of patient, his nausea and vomiting have improved after treatment.  He is starting to feel little bit better, however due to patient's bounce back after recent admission for small bowel obstruction, will admit patient to  hospitalist service.   Consulting with general surgery. At 1445,  spoke with general surgery and spoke with Dr. Thermon Leyland. He recommends speaking with GI due to patient's history of Crohn's Disease.   At 1503, I spoke with Dr. Olevia Bowens from [place]. He will admit patient to service for small bowel obstruction.   On request of both general surgery, and Dr. Olevia Bowens, I consulted GI @1538 , I spoke with GI from [facility], Dr. Therisa Doyne. She recommends Clear Liquid Diet if patient can tolerate it. She also recommends daily Methylprednisone IV 40 mg while patient is hospitalized.     Final Clinical Impression(s) / ED Diagnoses Final diagnoses:  Small bowel obstruction [facility])    Rx / DC Orders ED Discharge Orders     None        Adolphus Birchwood, PA-C 02/14/21 1546    Sherre Poot, Adora Fridge, PA-C 02/15/21 5183    Margette Fast, MD 02/16/21 [phone]

## 2021-02-14 NOTE — ED Triage Notes (Addendum)
Patient was recently hospitalized with SBO. Patient reports that he began having abdominal pain at 0500 today. Patient also c/o N/V and states he did have a BM this AM.

## 2021-02-14 NOTE — H&P (Signed)
History and Physical    Anthony Reed ELF:[id number] DOB: 02-12-1986 DOA: 02/14/2021  PCP: Vivi Barrack, MD  Patient coming from: Home.  I have personally briefly reviewed patient's old medical records in [place]  Chief Complaint: Abdominal pain.  HPI: Anthony Reed is a 35 y.o. male with medical history significant of seasonal allergies, Crohn's disease with history of small bowel resection, history of small bowel obstructions,  GERD, Wolf Parkinson's White pattern who is coming to the emergency department with complaints of abdominal pain associated with nausea and 3 episodes of emesis since early this morning.  He has also has some loose stools but no hematochezia or melena.  He denies fever, chills, rhinorrhea, dyspnea, wheezing or hemoptysis.  He has a mild sore throat which he attributes to vomiting.  No dyspnea, wheezing or hemoptysis.  No chest pain, palpitations, diaphoresis, PND, orthopnea or pitting edema lower extremities.  Denied dysuria, flank pain, frequency or hematuria.  Denied polyuria, polydipsia, polyphagia or blurred vision.  ED Course: Initial vital signs were temperature 97.9 F, pulse 85, respirations 20, BP 130/94 mmHg O2 sat 100% on room air.  The patient received 1000 mL of NS bolus, morphine 4 mg IVP and Zofran 4 mg IVP.  GI was contacted and and Dr. Therisa Doyne recommended IV hydration, Solu-Medrol 40 mg daily, clear liquid diet and symptoms treatment.  Lab work: CBC showed a white count of 14.9, hemoglobin 18.4 g/dL platelets 379.  CMP showed normal electrolytes.  Glucose 134, BUN 17 and creatinine 1.48 mg/dL.  Total protein was 8.4 and albumin 5.2 g/dL.  AST was 45 units/L.  Total bilirubin was 3.1 mg/dL (1.8 mg/dL 12 days ago).  Imaging: CT abdomen/pelvis with contrast showed prior ileocecectomy with anastomosis.  There were proximal small bowel loops are upper normal caliber to mildly dilated, similar to previous exam.  There are mild wall thickening of a small  bowel loop in the right lower quadrant suspicious for active Crohn's disease.  Ovoid radiopacity in small bowel loop consistent with ingested foreign body.  There is a small right inguinal hernia containing fat.  Please see images and full radiology report for further detail.  Review of Systems: As per HPI otherwise all other systems reviewed and are negative.  Past Medical History:  Diagnosis Date   Allergy    Crohn disease ([place])    Crohn disease ([place])    GERD (gastroesophageal reflux disease)    Wolff-Parkinson-White (WPW) pattern    Past Surgical History:  Procedure Laterality Date   BOWEL RESECTION  2016   Small bowel   Social History  reports that he quit smoking about 12 years ago. His smoking use included cigarettes. He has never used smokeless tobacco. He reports current alcohol use. He reports that he does not use drugs.  No Known Allergies  Family History  Problem Relation Age of Onset   Thyroid disease Mother    Asthma Father    Hyperlipidemia Father    Hypertension Father    Early death Maternal Grandfather    Cancer Maternal Grandfather 72   COPD Paternal Grandmother    Heart disease Paternal Grandfather    Hyperlipidemia Paternal Grandfather    Hypertension Paternal Grandfather    Kidney disease Paternal Grandfather    Asthma Sister    Colon cancer Neg Hx    Prostate cancer Neg Hx    Prior to Admission medications   Medication Sig Start Date End Date Taking? Authorizing Provider  ferrous sulfate 325 (65  FE) MG tablet Take 325 mg by mouth daily with breakfast.   Yes [provider]  loratadine (CLARITIN) 10 MG tablet Take 10 mg by mouth daily as needed for itching, rhinitis or allergies.   Yes [provider]  Multiple Vitamin (THERA) TABS Take 1 tablet by mouth daily.   Yes [provider]  Omega-3 Fatty Acids (FISH OIL PO) Take 1 capsule by mouth daily.   Yes [provider]  Probiotic Product (PROBIOTIC PO) Take 1 tablet  by mouth daily.   Yes [provider]  ustekinumab (STELARA) 90 MG/ML SOSY injection Inject 90 mg into the skin See admin instructions. INJECT 1 SYRINGE UNDER THE SKIN ONCE EVERY 8 WEEKS. REFRIGERATE. DO NOT FREEZE. 03/19/20  Yes [provider]   Physical Exam: Vitals:   02/14/21 1600 02/14/21 1606 02/14/21 1612 02/14/21 1613  BP: 105/71 105/65  105/65  Pulse: 61 65 63 60  Resp: 18  18 17   Temp:    97.9 F (36.6 C)  TempSrc:    Oral  SpO2: 99% 99% 98% 98%  Weight:      Height:       Constitutional: NAD, calm, comfortable Eyes: PERRL, lids and conjunctivae normal ENMT: Mucous membranes are mildly dry. Posterior pharynx clear of any exudate or lesions. Neck: normal, supple, no masses, no thyromegaly Respiratory: clear to auscultation bilaterally, no wheezing, no crackles. Normal respiratory effort. No accessory muscle use.  Cardiovascular: Regular rate and rhythm, no murmurs / rubs / gallops. No extremity edema. 2+ pedal pulses. No carotid bruits.  Abdomen: No distention.  Bowel sounds positive.  Soft, mild LLQ tenderness, no guarding or rebound, no masses palpated. No hepatosplenomegaly. Musculoskeletal: no clubbing / cyanosis. Good ROM, no contractures. Normal muscle tone.  Skin: no rashes, lesions, ulcers  Neurologic: CN 2-12 grossly intact. Sensation intact, DTR normal. Strength 5/5 in all 4.  Psychiatric: Normal judgment and insight. Alert and oriented x 3. Normal mood.   Labs on Admission: I have personally reviewed following labs and imaging studies  CBC: Recent Labs  Lab 02/14/21 1132  WBC 14.9*  HGB 18.4*  HCT 51.3  MCV 84.2  PLT 597   Basic Metabolic Panel: Recent Labs  Lab 02/14/21 1132  NA 139  K 3.8  CL 101  CO2 23  GLUCOSE 134*  BUN 17  CREATININE 1.48*  CALCIUM 10.0    GFR: Estimated Creatinine Clearance: 79.5 mL/min (A) (by C-G formula based on SCr of 1.48 mg/dL (H)).  Liver Function Tests: Recent Labs  Lab 02/14/21 1132   AST 45*  ALT 43  ALKPHOS 76  BILITOT 3.1*  PROT 8.4*  ALBUMIN 5.2*   Radiological Exams on Admission: CT Abdomen Pelvis W Contrast  Addendum Date: 02/14/2021   ADDENDUM REPORT: 02/14/2021 13:32 ADDENDUM: The radiopaque foreign body identified within a small bowel loop in the RIGHT pelvis was present in the lower central pelvis on the prior study of 02/02/2021. This is of uncertain etiology but is not causing obstruction. Electronically Signed   By: Lavonia Dana M.D.   On: 02/14/2021 13:32   Result Date: 02/14/2021 CLINICAL DATA:  Crohn's disease, hospitalized 2 weeks ago for small-bowel obstruction, possible adhesions, suspected small-bowel obstruction EXAM: CT ABDOMEN AND PELVIS WITH CONTRAST TECHNIQUE: Multidetector CT imaging of the abdomen and pelvis was performed using the standard protocol following bolus administration of intravenous contrast. Sagittal and coronal MPR images reconstructed from axial data set. CONTRAST:  75m OMNIPAQUE IOHEXOL 350 MG/ML SOLN IV. No  oral contrast. COMPARISON:  02/02/2021 FINDINGS: Lower chest: Lung bases clear Hepatobiliary: Gallbladder and liver normal appearance Pancreas: Normal appearance Spleen: Normal appearance Adrenals/Urinary Tract: Adrenal glands, kidneys, ureters, and bladder normal appearance Stomach/Bowel: Prior ileo-cecectomy with anastomosis. Scattered fluid within colon. A small bowel loop in the RIGHT lower quadrant demonstrates mild wall thickening suspicious for active Crohn's disease. Proximal small bowel loops are upper normal caliber to mildly dilated, similar to previous exam. Staple lines from prior small-bowel resections with anastomoses. The more dilated segments are proximal to the observed anastomoses. Findings likely represent partial small bowel obstruction. Ovoid radiopacity 15 x 10 x 14 mm identified within a small bowel loop in the RIGHT pelvis, consistent with ingested foreign body. Vascular/Lymphatic: Vascular structures patent.   No adenopathy. Reproductive: Unremarkable prostate gland and seminal vesicles Other: No free air or free fluid. Small RIGHT inguinal hernia containing fat. Musculoskeletal: Unremarkable IMPRESSION: Prior ileo-cecectomy with anastomosis. Proximal small bowel loops are upper normal caliber to mildly dilated, similar to previous exam. Mild wall thickening of a small bowel loop in the RIGHT lower quadrant suspicious for active Crohn's disease. Ovoid radiopacity within a small bowel loop in the RIGHT pelvis 15 x 10 x 14 mm consistent with ingested foreign body. Small RIGHT inguinal hernia containing fat. Electronically Signed: By: Lavonia Dana M.D. On: 02/14/2021 12:50    EKG: Independently reviewed.   Assessment/Plan Principal Problem:   Partial small bowel obstruction (HCC) Observation/telemetry. Continue IV fluids. Continue Solu-Medrol 40 mg IVP daily. Analgesics as needed. Antiemetics as needed. Clear liquids diet. Follow CBC and CMP in AM. Consider inpatient GI evaluation if no improvement.  Active Problems:   Dehydration Labs showing hemoconcentration. Continue IV fluids. Repeat labs in the morning.    History of Crohn's disease As above. Scheduled to see GI next week.    Wolff-Parkinson-White (WPW) pattern Keep electrolytes optimized.    GERD (gastroesophageal reflux disease) Pantoprazole 40 mg IVP every 24 hours.   DVT prophylaxis: Lovenox SQ. Code Status:   Full code. Family Communication:   Disposition Plan:   Patient is from:  Home.  Anticipated DC to:  Home.  Anticipated DC date:  02/16/2021.  Anticipated DC barriers: Clinical status. Consults called:   Admission status:  Observation/telemetry.   Severity of Illness: High severity after presenting with partial bowel obstruction with dehydration in the setting of Crohn's disease exacerbation.  The patient will remain in the hospital for IV hydration, glucocorticoid and symptoms treatment.  Reubin Milan  MD [facility]  How to contact the [facility] Attending or Consulting provider [place] or covering provider during after hours [place], for this patient?   Check the care team in [facility] and look for a) attending/consulting [facility] provider listed and b) the [facility] team listed Log into [email or web address] and use [place]'s universal password to access. If you do not have the password, please contact the hospital operator. Locate the [facility] provider you are looking for under [facility] and page to a number that you can be directly reached. If you still have difficulty reaching the provider, please page the [facility] (Director on Call) for the Hospitalists listed on amion for assistance.  02/14/2021, 5:13 PM   This document was prepared using Dragon voice recognition software and may contain some unintended transcription errors.

## 2021-02-15 DIAGNOSIS — K56600 Partial intestinal obstruction, unspecified as to cause: Secondary | ICD-10-CM | POA: Diagnosis not present

## 2021-02-15 LAB — CBC
HCT: 41.4 % (ref 39.0–52.0)
Hemoglobin: 14.5 g/dL (ref 13.0–17.0)
MCH: 30.5 pg (ref 26.0–34.0)
MCHC: 35 g/dL (ref 30.0–36.0)
MCV: 87 fL (ref 80.0–100.0)
Platelets: 256 10*3/uL (ref 150–400)
RBC: 4.76 MIL/uL (ref 4.22–5.81)
RDW: 12.3 % (ref 11.5–15.5)
WBC: 10.7 10*3/uL — ABNORMAL HIGH (ref 4.0–10.5)
nRBC: 0 % (ref 0.0–0.2)

## 2021-02-15 LAB — COMPREHENSIVE METABOLIC PANEL
ALT: 26 U/L (ref 0–44)
AST: 21 U/L (ref 15–41)
Albumin: 3.6 g/dL (ref 3.5–5.0)
Alkaline Phosphatase: 54 U/L (ref 38–126)
Anion gap: 7 (ref 5–15)
BUN: 9 mg/dL (ref 6–20)
CO2: 25 mmol/L (ref 22–32)
Calcium: 8.7 mg/dL — ABNORMAL LOW (ref 8.9–10.3)
Chloride: 106 mmol/L (ref 98–111)
Creatinine, Ser: 1.07 mg/dL (ref 0.61–1.24)
GFR, Estimated: >60 mL/min (ref >60)
Glucose, Bld: 125 mg/dL — ABNORMAL HIGH (ref 70–99)
Potassium: 4 mmol/L (ref 3.5–5.1)
Sodium: 138 mmol/L (ref 135–145)
Total Bilirubin: 2.8 mg/dL — ABNORMAL HIGH (ref 0.3–1.2)
Total Protein: 5.7 g/dL — ABNORMAL LOW (ref 6.5–8.1)

## 2021-02-15 MED ORDER — PREDNISONE 10 MG PO TABS: ORAL_TABLET | ORAL | 0 refills | Status: AC

## 2021-02-15 MED ORDER — DOXYLAMINE SUCCINATE (SLEEP) 25 MG PO TABS
25.0000 mg | ORAL_TABLET | Freq: Once | ORAL | Status: AC
  Administered 2021-02-15: 25 mg via ORAL
  Filled 2021-02-15: qty 1

## 2021-02-15 MED ORDER — MELATONIN 5 MG PO TABS
5.0000 mg | ORAL_TABLET | Freq: Once | ORAL | Status: DC
  Filled 2021-02-15: qty 1

## 2021-02-15 NOTE — Consult Note (Signed)
[facility] Consult  Referring Provider: ER Primary Care Physician:  Vivi Barrack, MD Primary Gastroenterologist: Unassigned(Dr.Katopes with [facility])  Reason for Consultation: Partial small bowel obstruction, Crohn's disease  HPI: Anthony Reed is a 35 y.o. male was in his usual state of health until yesterday when he developed abdominal pain, nausea, nonbloody emesis and came to the ER where a CAT scan showed prior ileocecectomy with anastomosis mildly dilated proximal small bowel loops, mild wall thickening of small bowel loops suspicious for active Crohn's disease.  Patient was diagnosed with Crohn's disease in April of 2016. Subsequently he used Humira for several years and has required prednisone on rare occasions for control of disease. In November 2016, he developed obstruction and required surgery. There after he has been on Humira until December of last year when he was switched to [facility] which she takes every 8 weeks, next dose will be due tomorrow. Last colonoscopy was October 2021. He has an upcoming appointment with his GI this Wednesday.  Patient was given IV methylprednisolone and states he has had several bowel movements overnight and today morning. He reports resolution of abdominal pain. He has been able to tolerate clear liquids, is requesting for his diet to be advanced and is requesting to be sent home.  Patient denies oral ulcers, skin rashes but complains of bilateral knee joint pain. He is a non-smoker. There is no family history of IBD or colon cancer. He denies unintentional weight loss. He has mild acid reflux for which she takes PPI over-the-counter intermittently. Occasionally he has solid food dysphagia, especially when eating [place] sandwiches, however this is intermittent to rare. Normally he has at least 1 bowel movement today. Occasionally notices blood in his stool, states he has had a flexible sigmoidoscopy for further evaluation which  showed fissures in the past.   Past Medical History:  Diagnosis Date   Allergy    Crohn disease ([place])    Crohn disease ([place])    GERD (gastroesophageal reflux disease)    Wolff-Parkinson-White (WPW) pattern     Past Surgical History:  Procedure Laterality Date   BOWEL RESECTION  2016   Small bowel    Prior to Admission medications   Medication Sig Start Date End Date Taking? Authorizing Provider  ferrous sulfate 325 (65 FE) MG tablet Take 325 mg by mouth daily with breakfast.   Yes [provider]  loratadine (CLARITIN) 10 MG tablet Take 10 mg by mouth daily as needed for itching, rhinitis or allergies.   Yes [provider]  Multiple Vitamin (THERA) TABS Take 1 tablet by mouth daily.   Yes [provider]  Omega-3 Fatty Acids (FISH OIL PO) Take 1 capsule by mouth daily.   Yes [provider]  Probiotic Product (PROBIOTIC PO) Take 1 tablet by mouth daily.   Yes [provider]  ustekinumab (STELARA) 90 MG/ML SOSY injection Inject 90 mg into the skin See admin instructions. INJECT 1 SYRINGE UNDER THE SKIN ONCE EVERY 8 WEEKS. REFRIGERATE. DO NOT FREEZE. 03/19/20  Yes [provider]    Current Facility-Administered Medications  Medication Dose Route Frequency Provider Last Rate Last Admin   acetaminophen (TYLENOL) tablet 650 mg  650 mg Oral Q6H PRN Reubin Milan, MD       Or   acetaminophen (TYLENOL) suppository 650 mg  650 mg Rectal Q6H PRN Reubin Milan, MD       enoxaparin (LOVENOX) injection 40 mg  40 mg Subcutaneous Q24H Reubin Milan, MD  40 mg at 02/14/21 1610   HYDROmorphone (DILAUDID) injection 1 mg  1 mg Intravenous Q4H PRN Reubin Milan, MD   1 mg at 02/14/21 2038   melatonin tablet 5 mg  5 mg Oral Once Lovey Newcomer T, NP       methylPREDNISolone sodium succinate (SOLU-MEDROL) 40 mg/mL injection 40 mg  40 mg Intravenous Daily Reubin Milan, MD   40 mg at 02/15/21 0924   ondansetron  (ZOFRAN) tablet 4 mg  4 mg Oral Q6H PRN Reubin Milan, MD       Or   ondansetron [facility]) injection 4 mg  4 mg Intravenous Q6H PRN Reubin Milan, MD       pantoprazole (PROTONIX) injection 40 mg  40 mg Intravenous Q24H Reubin Milan, MD   40 mg at 02/14/21 1716    Allergies as of 02/14/2021   (No Known Allergies)    Family History  Problem Relation Age of Onset   Thyroid disease Mother    Asthma Father    Hyperlipidemia Father    Hypertension Father    Early death Maternal Grandfather    Cancer Maternal Grandfather 40   COPD Paternal Grandmother    Heart disease Paternal Grandfather    Hyperlipidemia Paternal Grandfather    Hypertension Paternal Grandfather    Kidney disease Paternal Grandfather    Asthma Sister    Colon cancer Neg Hx    Prostate cancer Neg Hx     Social History   Socioeconomic History   Marital status: Married    Spouse name: Not on file   Number of children: Not on file   Years of education: Not on file   Highest education level: Not on file  Occupational History   Not on file  Tobacco Use   Smoking status: Former    Types: Cigarettes    Quit date: 12/11/2008    Years since quitting: 12.1   Smokeless tobacco: Never  Vaping Use   Vaping Use: Never used  Substance and Sexual Activity   Alcohol use: Yes    Alcohol/week: 0.0 standard drinks   Drug use: Never   Sexual activity: Yes  Other Topics Concern   Not on file  Social History Narrative   Not on file   Social Determinants of Health   Financial Resource Strain: Not on file  Food Insecurity: Not on file  Transportation Needs: Not on file  Physical Activity: Not on file  Stress: Not on file  Social Connections: Not on file  Intimate Partner Violence: Not on file    Review of Systems: Positive for: GI: Described in detail in HPI.    Gen: Denies any fever, chills, rigors, night sweats, anorexia, fatigue, weakness, malaise, involuntary weight loss, and sleep  disorder CV: Denies chest pain, angina, palpitations, syncope, orthopnea, PND, peripheral edema, and claudication. Resp: Denies dyspnea, cough, sputum, wheezing, coughing up blood. GU : Denies urinary burning, blood in urine, urinary frequency, urinary hesitancy, nocturnal urination, and urinary incontinence. MS: Bilateral knee joint pain.  Denies muscle weakness, cramps, atrophy.  Derm: Denies rash, itching, oral ulcerations, hives, unhealing ulcers.  Psych: Denies depression, anxiety, memory loss, suicidal ideation, hallucinations,  and confusion. Heme: Denies bruising, bleeding, and enlarged lymph nodes. Neuro:  Denies any headaches, dizziness, paresthesias. Endo:  Denies any problems with DM, thyroid, adrenal function.  Physical Exam: Vital signs in last 24 hours: Temp:  [97.7 F (36.5 C)-98.7 F (37.1 C)] 97.7 F (36.5 C) (10/09 0452) Pulse  Rate:  [60-85] 64 (10/09 0452) Resp:  [14-20] 14 (10/09 0452) BP: (104-158)/(61-94) 104/61 (10/09 0452) SpO2:  [97 %-100 %] 98 % (10/09 0452) Weight:  [90.7 kg] 90.7 kg (10/08 1128) Last BM Date: 02/15/21  General:   Alert,  Well-developed, well-nourished, pleasant and cooperative in NAD Head:  Normocephalic and atraumatic. Eyes:  Sclera clear, no icterus.   Conjunctiva pink. Ears:  Normal auditory acuity. Nose:  No deformity, discharge,  or lesions. Mouth:  No deformity or lesions.  Oropharynx pink & moist. Neck:  Supple; no masses or thyromegaly. Lungs:  Clear throughout to auscultation.   No wheezes, crackles, or rhonchi. No acute distress. Heart:  Regular rate and rhythm; no murmurs, clicks, rubs,  or gallops. Extremities:  Without clubbing or edema. Neurologic:  Alert and  oriented x4;  grossly normal neurologically. Skin:  Intact without significant lesions or rashes. Psych:  Alert and cooperative. Normal mood and affect. Abdomen:  Soft, nontender and nondistended. No masses, hepatosplenomegaly or hernias noted. Normal bowel sounds,  without guarding, and without rebound.         Lab Results: Recent Labs    02/14/21 1132 02/15/21 0502  WBC 14.9* 10.7*  HGB 18.4* 14.5  HCT 51.3 41.4  PLT 379 256   BMET Recent Labs    02/14/21 1132 02/15/21 0502  NA 139 138  K 3.8 4.0  CL 101 106  CO2 23 25  GLUCOSE 134* 125*  BUN 17 9  CREATININE 1.48* 1.07  CALCIUM 10.0 8.7*   LFT Recent Labs    02/15/21 0502  PROT 5.7*  ALBUMIN 3.6  AST 21  ALT 26  ALKPHOS 54  BILITOT 2.8*   PT/INR No results for input(s): LABPROT, INR in the last 72 hours.  Studies/Results: CT Abdomen Pelvis W Contrast  Addendum Date: 02/14/2021   ADDENDUM REPORT: 02/14/2021 13:32 ADDENDUM: The radiopaque foreign body identified within a small bowel loop in the RIGHT pelvis was present in the lower central pelvis on the prior study of 02/02/2021. This is of uncertain etiology but is not causing obstruction. Electronically Signed   By: Lavonia Dana M.D.   On: 02/14/2021 13:32   Result Date: 02/14/2021 CLINICAL DATA:  Crohn's disease, hospitalized 2 weeks ago for small-bowel obstruction, possible adhesions, suspected small-bowel obstruction EXAM: CT ABDOMEN AND PELVIS WITH CONTRAST TECHNIQUE: Multidetector CT imaging of the abdomen and pelvis was performed using the standard protocol following bolus administration of intravenous contrast. Sagittal and coronal MPR images reconstructed from axial data set. CONTRAST:  32m OMNIPAQUE IOHEXOL 350 MG/ML SOLN IV. No oral contrast. COMPARISON:  02/02/2021 FINDINGS: Lower chest: Lung bases clear Hepatobiliary: Gallbladder and liver normal appearance Pancreas: Normal appearance Spleen: Normal appearance Adrenals/Urinary Tract: Adrenal glands, kidneys, ureters, and bladder normal appearance Stomach/Bowel: Prior ileo-cecectomy with anastomosis. Scattered fluid within colon. A small bowel loop in the RIGHT lower quadrant demonstrates mild wall thickening suspicious for active Crohn's disease. Proximal small  bowel loops are upper normal caliber to mildly dilated, similar to previous exam. Staple lines from prior small-bowel resections with anastomoses. The more dilated segments are proximal to the observed anastomoses. Findings likely represent partial small bowel obstruction. Ovoid radiopacity 15 x 10 x 14 mm identified within a small bowel loop in the RIGHT pelvis, consistent with ingested foreign body. Vascular/Lymphatic: Vascular structures patent.  No adenopathy. Reproductive: Unremarkable prostate gland and seminal vesicles Other: No free air or free fluid. Small RIGHT inguinal hernia containing fat. Musculoskeletal: Unremarkable IMPRESSION: Prior ileo-cecectomy with anastomosis. Proximal  small bowel loops are upper normal caliber to mildly dilated, similar to previous exam. Mild wall thickening of a small bowel loop in the RIGHT lower quadrant suspicious for active Crohn's disease. Ovoid radiopacity within a small bowel loop in the RIGHT pelvis 15 x 10 x 14 mm consistent with ingested foreign body. Small RIGHT inguinal hernia containing fat. Electronically Signed: By: Lavonia Dana M.D. On: 02/14/2021 12:50    Impression: Crohn's disease Nausea, vomiting, abdominal pain on admission with active Crohn's disease and partial small bowel obstruction on admission-appears to have resolved clinically T bili elevated at 2.8, was 3.1 on admission, gallbladder and liver appear normal?  Gilbert's disease TPMT level from 12/2014 was normal   Plan: Patient has responded well to IV methylprednisolone and wants to go home today Recommend sending home on tapering dose of prednisone, 40 mg p.o. daily for a week, then 30 mg p.o. daily for a week, then 20 mg p.o. daily for a week, then 10 mg p.o. daily for a week.  Patient may benefit from addition of immunomodulators -azathioprine/6 mercaptopurine. Advised patient to take his Stelara, due tomorrow.  If patient is able to tolerate regular diet, okay to DC from GI  standpoint.  Patient has a follow-up with his GI Dr.Katopes on Wednesday.  Advised patient he may require ESR, CRP, fecal calprotectin to ascertain level of inflammation.  Since patient responded to steroids, small bowel obstruction could be related to inflammatory stricture, less likely a fibrous stenotic stricture.  Discussed the same with the patient.   LOS: 0 days   Ronnette Juniper, MD  02/15/2021, 10:08 AM

## 2021-02-15 NOTE — Progress Notes (Signed)
Patient has been explained discharge instructions and confirmed that he understood. He had no further questions. Per patient request, patient ambulated to the main entrance. IV has been removed and catheter was intact. Site was clean, dry and intact.

## 2021-02-15 NOTE — Discharge Summary (Signed)
Physician Discharge Summary  Broedy Osbourne LKJ:[id number] DOB: 08-Jun-1985 DOA: 02/14/2021  PCP: Vivi Barrack, MD  Admit date: 02/14/2021 Discharge date: 02/15/2021  Admitted From: Home Disposition: Home  Recommendations for Outpatient Follow-up:  Follow up with PCP in 1-2 weeks Please obtain BMP/CBC in one week Please follow up with GI and surgery as previously scheduled  Home Health: None Equipment/Devices: None  Discharge Condition: Stable CODE STATUS: Full Diet recommendation: Soft bland diet  Brief/Interim Summary:  AKI secondary to profound dehydration in the setting of likely Crohn's flare, resolving Labs resolving with IV fluids, now tolerating p.o. without any difficulty, follow-up outpatient with GI as scheduled   Wolff-Parkinson-White (WPW) pattern Keep electrolytes optimized.   GERD (gastroesophageal reflux disease) Pantoprazole 40 mg IVP every 24 hours.   Discharge Instructions  Discharge Instructions     Diet - low sodium heart healthy   Complete by: As directed    Increase activity slowly   Complete by: As directed       Allergies as of 02/15/2021   No Known Allergies      Medication List     TAKE these medications    ferrous sulfate 325 (65 FE) MG tablet Take 325 mg by mouth daily with breakfast.   FISH OIL PO Take 1 capsule by mouth daily.   loratadine 10 MG tablet Commonly known as: CLARITIN Take 10 mg by mouth daily as needed for itching, rhinitis or allergies.   predniSONE 10 MG tablet Commonly known as: DELTASONE Take 4 tablets (40 mg total) by mouth daily for 3 days, THEN 3 tablets (30 mg total) daily for 3 days, THEN 2 tablets (20 mg total) daily for 3 days, THEN 1 tablet (10 mg total) daily for 3 days. Start taking on: February 15, 2021   PROBIOTIC PO Take 1 tablet by mouth daily.   Stelara 90 MG/ML Sosy injection Generic drug: ustekinumab Inject 90 mg into the skin See admin instructions. INJECT 1 SYRINGE UNDER THE SKIN ONCE  EVERY 8 WEEKS. REFRIGERATE. DO NOT FREEZE.   Thera Tabs Take 1 tablet by mouth daily.        No Known Allergies  Consultations: GI   Procedures/Studies: CT Abdomen Pelvis W Contrast  Addendum Date: 02/14/2021   ADDENDUM REPORT: 02/14/2021 13:32 ADDENDUM: The radiopaque foreign body identified within a small bowel loop in the RIGHT pelvis was present in the lower central pelvis on the prior study of 02/02/2021. This is of uncertain etiology but is not causing obstruction. Electronically Signed   By: Lavonia Dana M.D.   On: 02/14/2021 13:32   Result Date: 02/14/2021 CLINICAL DATA:  Crohn's disease, hospitalized 2 weeks ago for small-bowel obstruction, possible adhesions, suspected small-bowel obstruction EXAM: CT ABDOMEN AND PELVIS WITH CONTRAST TECHNIQUE: Multidetector CT imaging of the abdomen and pelvis was performed using the standard protocol following bolus administration of intravenous contrast. Sagittal and coronal MPR images reconstructed from axial data set. CONTRAST:  69m OMNIPAQUE IOHEXOL 350 MG/ML SOLN IV. No oral contrast. COMPARISON:  02/02/2021 FINDINGS: Lower chest: Lung bases clear Hepatobiliary: Gallbladder and liver normal appearance Pancreas: Normal appearance Spleen: Normal appearance Adrenals/Urinary Tract: Adrenal glands, kidneys, ureters, and bladder normal appearance Stomach/Bowel: Prior ileo-cecectomy with anastomosis. Scattered fluid within colon. A small bowel loop in the RIGHT lower quadrant demonstrates mild wall thickening suspicious for active Crohn's disease. Proximal small bowel loops are upper normal caliber to mildly dilated, similar to previous exam. Staple lines from prior small-bowel resections with anastomoses. The more dilated segments  are proximal to the observed anastomoses. Findings likely represent partial small bowel obstruction. Ovoid radiopacity 15 x 10 x 14 mm identified within a small bowel loop in the RIGHT pelvis, consistent with ingested foreign  body. Vascular/Lymphatic: Vascular structures patent.  No adenopathy. Reproductive: Unremarkable prostate gland and seminal vesicles Other: No free air or free fluid. Small RIGHT inguinal hernia containing fat. Musculoskeletal: Unremarkable IMPRESSION: Prior ileo-cecectomy with anastomosis. Proximal small bowel loops are upper normal caliber to mildly dilated, similar to previous exam. Mild wall thickening of a small bowel loop in the RIGHT lower quadrant suspicious for active Crohn's disease. Ovoid radiopacity within a small bowel loop in the RIGHT pelvis 15 x 10 x 14 mm consistent with ingested foreign body. Small RIGHT inguinal hernia containing fat. Electronically Signed: By: Lavonia Dana M.D. On: 02/14/2021 12:50   CT ABDOMEN PELVIS W CONTRAST  Result Date: 02/02/2021 CLINICAL DATA:  Crohn's disease, abdominal pain and cramping, vomiting, bright red blood per rectum. EXAM: CT ABDOMEN AND PELVIS WITH CONTRAST TECHNIQUE: Multidetector CT imaging of the abdomen and pelvis was performed using the standard protocol following bolus administration of intravenous contrast. CONTRAST:  40m OMNIPAQUE IOHEXOL 350 MG/ML SOLN COMPARISON:  None. FINDINGS: Lower chest: The visualized lung bases are clear. The visualized heart and pericardium are unremarkable. Hepatobiliary: No focal liver abnormality is seen. No gallstones, gallbladder wall thickening, or biliary dilatation. Pancreas: Unremarkable Spleen: Unremarkable Adrenals/Urinary Tract: Adrenal glands are unremarkable. Kidneys are normal, without renal calculi, focal lesion, or hydronephrosis. Bladder is unremarkable. Stomach/Bowel: Surgical changes of ileocolectomy as well as small bowel resection x2 has been performed. There are multiple dilated fluid-filled loops of small bowel identified proximal to both small bowel anastomosis in keeping with serial points of partial small bowel obstruction. Initial transition point is seen within the deep pelvis at axial image #  64/2 and sagittal image # 66/6. Subsequent stenotic segment is seen within the right lower quadrant at axial image # 52/2 and coronal image # 64/5. There is gas and stool seen throughout the colon in keeping with a partial serial small-bowel obstruction. Normal enhancement of the bowel. No evidence of focal inflammation. Mild ascites. No free intraperitoneal gas. Vascular/Lymphatic: The abdominal vasculature is unremarkable. No pathologic adenopathy within the abdomen and pelvis. Reproductive: Prostate is unremarkable. Other: Tiny bilateral fat containing inguinal hernia. The rectum is unremarkable. Musculoskeletal: No acute bone abnormality. No lytic or blastic bone lesion. IMPRESSION: Status post ileocolectomy and small-bowel resection x2. Serial partial small bowel obstruction in the region of both small bowel anastomosis with moderate upstream dilation of several loops of small bowel. No superimposed focal enteric inflammatory change. Mild ascites. Electronically Signed   By: AFidela SalisburyM.D.   On: 02/02/2021 00:58   DG Abd Portable 1V-Small Bowel Obstruction Protocol-initial, 8 hr delay  Result Date: 02/02/2021 CLINICAL DATA:  Small-bowel obstruction EXAM: PORTABLE ABDOMEN - 1 VIEW COMPARISON:  02/02/2021 FINDINGS: Supine frontal view of the abdomen and pelvis excluding the hemidiaphragms was obtained 8 hours after oral contrast administration. Oral contrast has progressed throughout the colon. No evidence of small-bowel obstruction or ileus. No masses or abnormal calcifications. IMPRESSION: 1. Oral contrast throughout the colon. No evidence of bowel obstruction or ileus. Electronically Signed   By: MRanda NgoM.D.   On: 02/02/2021 19:29   DG Abd Portable 1V  Result Date: 02/02/2021 CLINICAL DATA:  Evaluate for small bowel obstruction. Abdominal pain. EXAM: PORTABLE ABDOMEN - 1 VIEW COMPARISON:  CT 02/02/2021 FINDINGS: Supine images of the  abdomen pelvis demonstrate gas in the small and large  bowel. Mildly dilated loops of small bowel in the left abdomen. Small bowel distension has slightly decreased from the CT tomogram on the same day. No large abdominopelvic calcifications. IMPRESSION: Small bowel distension has slightly decreased from the study earlier on 02/02/2021. No evidence for a high-grade small bowel obstruction. Electronically Signed   By: Markus Daft M.D.   On: 02/02/2021 08:52     Subjective: No acute issues or events overnight, symptoms resolved requesting discharge home which is certainly reasonable   Discharge Exam: Vitals:   02/14/21 2032 02/15/21 0452  BP: 116/78 104/61  Pulse: 60 64  Resp:  14  Temp: 98.7 F (37.1 C) 97.7 F (36.5 C)  SpO2: 99% 98%   Vitals:   02/14/21 1612 02/14/21 1613 02/14/21 2032 02/15/21 0452  BP:  105/65 116/78 104/61  Pulse: 63 60 60 64  Resp: 18 17  14   Temp:  97.9 F (36.6 C) 98.7 F (37.1 C) 97.7 F (36.5 C)  TempSrc:  Oral Axillary   SpO2: 98% 98% 99% 98%  Weight:      Height:        General: Pt is alert, awake, not in acute distress Cardiovascular: RRR, S1/S2 +, no rubs, no gallops Respiratory: CTA bilaterally, no wheezing, no rhonchi Abdominal: Soft, NT, ND, bowel sounds + Extremities: no edema, no cyanosis    The results of significant diagnostics from this hospitalization (including imaging, microbiology, ancillary and laboratory) are listed below for reference.     Microbiology: Recent Results (from the past 240 hour(s))  Resp Panel by RT-PCR (Flu A&B, Covid)     Status: None   Collection Time: 02/14/21  5:07 PM  Result Value Ref Range Status   SARS Coronavirus 2 by RT PCR NEGATIVE NEGATIVE Final    Comment: (NOTE) SARS-CoV-2 target nucleic acids are NOT DETECTED.  The SARS-CoV-2 RNA is generally detectable in upper respiratory specimens during the acute phase of infection. The lowest concentration of SARS-CoV-2 viral copies this assay can detect is 138 copies/mL. A negative result does not  preclude SARS-Cov-2 infection and should not be used as the sole basis for treatment or other patient management decisions. A negative result may occur with  improper specimen collection/handling, submission of specimen other than nasopharyngeal swab, presence of viral mutation(s) within the areas targeted by this assay, and inadequate number of viral copies(<138 copies/mL). A negative result must be combined with clinical observations, patient history, and epidemiological information. The expected result is Negative.  Fact Sheet for Patients:  EntrepreneurPulse.com.au  Fact Sheet for Healthcare Providers:  IncredibleEmployment.be  This test is no t yet approved or cleared by the [place] FDA and  has been authorized for detection and/or diagnosis of SARS-CoV-2 by FDA under an Emergency Use Authorization (EUA). This EUA will remain  in effect (meaning this test can be used) for the duration of the COVID-19 declaration under Section 564(b)(1) of the Act, 21 U.S.C.section 360bbb-3(b)(1), unless the authorization is terminated  or revoked sooner.       Influenza A by PCR NEGATIVE NEGATIVE Final   Influenza B by PCR NEGATIVE NEGATIVE Final    Comment: (NOTE) The Xpert Xpress SARS-CoV-2/FLU/RSV plus assay is intended as an aid in the diagnosis of influenza from Nasopharyngeal swab specimens and should not be used as a sole basis for treatment. Nasal washings and aspirates are unacceptable for Xpert Xpress SARS-CoV-2/FLU/RSV testing.  Fact Sheet for Patients: EntrepreneurPulse.com.au  Fact Sheet  for Healthcare Providers: IncredibleEmployment.be  This test is not yet approved or cleared by the [place] and has been authorized for detection and/or diagnosis of SARS-CoV-2 by FDA under an Emergency Use Authorization (EUA). This EUA will remain in effect (meaning this test can be used) for the  duration of the COVID-19 declaration under Section 564(b)(1) of the Act, 21 U.S.C. section 360bbb-3(b)(1), unless the authorization is terminated or revoked.  Performed at [facility], [place] [address], [place], [place] [address]      Labs: BNP (last 3 results) No results for input(s): BNP in the last 8760 hours. Basic Metabolic Panel: Recent Labs  Lab 02/14/21 1132 02/14/21 1725 02/15/21 0502  NA 139  --  138  K 3.8  --  4.0  CL 101  --  106  CO2 23  --  25  GLUCOSE 134*  --  125*  BUN 17  --  9  CREATININE 1.48*  --  1.07  CALCIUM 10.0  --  8.7*  MG  --  1.9  --    Liver Function Tests: Recent Labs  Lab 02/14/21 1132 02/15/21 0502  AST 45* 21  ALT 43 26  ALKPHOS 76 54  BILITOT 3.1* 2.8*  PROT 8.4* 5.7*  ALBUMIN 5.2* 3.6   Recent Labs  Lab 02/14/21 1132  LIPASE 32   No results for input(s): AMMONIA in the last 168 hours. CBC: Recent Labs  Lab 02/14/21 1132 02/15/21 0502  WBC 14.9* 10.7*  HGB 18.4* 14.5  HCT 51.3 41.4  MCV 84.2 87.0  PLT 379 256   Cardiac Enzymes: No results for input(s): CKTOTAL, CKMB, CKMBINDEX, TROPONINI in the last 168 hours. BNP: Invalid input(s): POCBNP CBG: No results for input(s): GLUCAP in the last 168 hours. D-Dimer No results for input(s): DDIMER in the last 72 hours. Hgb A1c No results for input(s): HGBA1C in the last 72 hours. Lipid Profile No results for input(s): CHOL, HDL, LDLCALC, TRIG, CHOLHDL, LDLDIRECT in the last 72 hours. Thyroid function studies No results for input(s): TSH, T4TOTAL, T3FREE, THYROIDAB in the last 72 hours.  Invalid input(s): FREET3 Anemia work up No results for input(s): VITAMINB12, FOLATE, FERRITIN, TIBC, IRON, RETICCTPCT in the last 72 hours. Urinalysis    Component Value Date/Time   COLORURINE YELLOW 02/14/2021 1656   APPEARANCEUR CLEAR 02/14/2021 1656   LABSPEC >1.046 (H) 02/14/2021 1656   PHURINE 8.0 02/14/2021 1656   GLUCOSEU NEGATIVE 02/14/2021 1656    HGBUR NEGATIVE 02/14/2021 1656   BILIRUBINUR NEGATIVE 02/14/2021 1656   KETONESUR 20 (A) 02/14/2021 1656   PROTEINUR NEGATIVE 02/14/2021 1656   NITRITE NEGATIVE 02/14/2021 1656   LEUKOCYTESUR NEGATIVE 02/14/2021 1656   Sepsis Labs Invalid input(s): PROCALCITONIN,  WBC,  LACTICIDVEN Microbiology Recent Results (from the past 240 hour(s))  Resp Panel by RT-PCR (Flu A&B, Covid)     Status: None   Collection Time: 02/14/21  5:07 PM  Result Value Ref Range Status   SARS Coronavirus 2 by RT PCR NEGATIVE NEGATIVE Final    Comment: (NOTE) SARS-CoV-2 target nucleic acids are NOT DETECTED.  The SARS-CoV-2 RNA is generally detectable in upper respiratory specimens during the acute phase of infection. The lowest concentration of SARS-CoV-2 viral copies this assay can detect is 138 copies/mL. A negative result does not preclude SARS-Cov-2 infection and should not be used as the sole basis for treatment or other patient management decisions. A negative result may occur with  improper specimen collection/handling, submission of specimen other than  nasopharyngeal swab, presence of viral mutation(s) within the areas targeted by this assay, and inadequate number of viral copies(<138 copies/mL). A negative result must be combined with clinical observations, patient history, and epidemiological information. The expected result is Negative.  Fact Sheet for Patients:  EntrepreneurPulse.com.au  Fact Sheet for Healthcare Providers:  IncredibleEmployment.be  This test is no t yet approved or cleared by the [place] FDA and  has been authorized for detection and/or diagnosis of SARS-CoV-2 by FDA under an Emergency Use Authorization (EUA). This EUA will remain  in effect (meaning this test can be used) for the duration of the COVID-19 declaration under Section 564(b)(1) of the Act, 21 U.S.C.section 360bbb-3(b)(1), unless the authorization is terminated  or  revoked sooner.       Influenza A by PCR NEGATIVE NEGATIVE Final   Influenza B by PCR NEGATIVE NEGATIVE Final    Comment: (NOTE) The Xpert Xpress SARS-CoV-2/FLU/RSV plus assay is intended as an aid in the diagnosis of influenza from Nasopharyngeal swab specimens and should not be used as a sole basis for treatment. Nasal washings and aspirates are unacceptable for Xpert Xpress SARS-CoV-2/FLU/RSV testing.  Fact Sheet for Patients: EntrepreneurPulse.com.au  Fact Sheet for Healthcare Providers: IncredibleEmployment.be  This test is not yet approved or cleared by the [place] FDA and has been authorized for detection and/or diagnosis of SARS-CoV-2 by FDA under an Emergency Use Authorization (EUA). This EUA will remain in effect (meaning this test can be used) for the duration of the COVID-19 declaration under Section 564(b)(1) of the Act, 21 U.S.C. section 360bbb-3(b)(1), unless the authorization is terminated or revoked.  Performed at [facility], [place] [address], [place], [place] [address]      Time coordinating discharge: Over 30 minutes  SIGNED:   Little Ishikawa, DO [facility] 02/15/2021, 12:31 PM Pager   If 7PM-7AM, please contact night-coverage [email or web address]

## 2021-03-02 LAB — HM COLONOSCOPY

## 2021-03-05 ENCOUNTER — Encounter: Payer: Self-pay | Admitting: Family Medicine

## 2021-03-30 DIAGNOSIS — S8263XA Displaced fracture of lateral malleolus of unspecified fibula, initial encounter for closed fracture: Secondary | ICD-10-CM | POA: Insufficient documentation

## 2021-07-05 ENCOUNTER — Inpatient Hospital Stay (HOSPITAL_COMMUNITY)
Admission: EM | Admit: 2021-07-05 | Discharge: 2021-07-06 | DRG: 387 | Disposition: A | Discharge status: Home / Self Care | Payer: 59 | Attending: Internal Medicine | Admitting: Internal Medicine

## 2021-07-05 ENCOUNTER — Emergency Department (HOSPITAL_COMMUNITY): Payer: 59

## 2021-07-05 ENCOUNTER — Other Ambulatory Visit: Payer: Self-pay

## 2021-07-05 ENCOUNTER — Encounter (HOSPITAL_COMMUNITY): Payer: Self-pay | Admitting: Emergency Medicine

## 2021-07-05 DIAGNOSIS — X58XXXD Exposure to other specified factors, subsequent encounter: Secondary | ICD-10-CM | POA: Diagnosis present

## 2021-07-05 DIAGNOSIS — I456 Pre-excitation syndrome: Secondary | ICD-10-CM | POA: Diagnosis present

## 2021-07-05 DIAGNOSIS — K50912 Crohn's disease, unspecified, with intestinal obstruction: Principal | ICD-10-CM | POA: Diagnosis present

## 2021-07-05 DIAGNOSIS — D751 Secondary polycythemia: Secondary | ICD-10-CM | POA: Diagnosis present

## 2021-07-05 DIAGNOSIS — E869 Volume depletion, unspecified: Secondary | ICD-10-CM | POA: Diagnosis present

## 2021-07-05 DIAGNOSIS — K219 Gastro-esophageal reflux disease without esophagitis: Secondary | ICD-10-CM | POA: Diagnosis present

## 2021-07-05 DIAGNOSIS — Z8249 Family history of ischemic heart disease and other diseases of the circulatory system: Secondary | ICD-10-CM

## 2021-07-05 DIAGNOSIS — K56609 Unspecified intestinal obstruction, unspecified as to partial versus complete obstruction: Secondary | ICD-10-CM | POA: Diagnosis not present

## 2021-07-05 DIAGNOSIS — Z87891 Personal history of nicotine dependence: Secondary | ICD-10-CM

## 2021-07-05 DIAGNOSIS — Z79899 Other long term (current) drug therapy: Secondary | ICD-10-CM

## 2021-07-05 DIAGNOSIS — Z83438 Family history of other disorder of lipoprotein metabolism and other lipidemia: Secondary | ICD-10-CM

## 2021-07-05 DIAGNOSIS — Z20822 Contact with and (suspected) exposure to covid-19: Secondary | ICD-10-CM | POA: Diagnosis present

## 2021-07-05 DIAGNOSIS — K50919 Crohn's disease, unspecified, with unspecified complications: Secondary | ICD-10-CM | POA: Diagnosis present

## 2021-07-05 DIAGNOSIS — Z825 Family history of asthma and other chronic lower respiratory diseases: Secondary | ICD-10-CM

## 2021-07-05 DIAGNOSIS — Z9049 Acquired absence of other specified parts of digestive tract: Secondary | ICD-10-CM

## 2021-07-05 DIAGNOSIS — T183XXD Foreign body in small intestine, subsequent encounter: Secondary | ICD-10-CM

## 2021-07-05 DIAGNOSIS — Z9889 Other specified postprocedural states: Secondary | ICD-10-CM

## 2021-07-05 DIAGNOSIS — Z8349 Family history of other endocrine, nutritional and metabolic diseases: Secondary | ICD-10-CM

## 2021-07-05 DIAGNOSIS — D72829 Elevated white blood cell count, unspecified: Secondary | ICD-10-CM | POA: Diagnosis present

## 2021-07-05 LAB — URINALYSIS, ROUTINE W REFLEX MICROSCOPIC
Bilirubin Urine: NEGATIVE
Glucose, UA: NEGATIVE mg/dL
Hgb urine dipstick: NEGATIVE
Ketones, ur: 5 mg/dL — AB
Leukocytes,Ua: NEGATIVE
Nitrite: NEGATIVE
Protein, ur: NEGATIVE mg/dL
Specific Gravity, Urine: 1.04 — ABNORMAL HIGH (ref 1.005–1.030)
pH: 7 (ref 5.0–8.0)

## 2021-07-05 LAB — COMPREHENSIVE METABOLIC PANEL
ALT: 41 U/L (ref 0–44)
AST: 37 U/L (ref 15–41)
Albumin: 4.4 g/dL (ref 3.5–5.0)
Alkaline Phosphatase: 74 U/L (ref 38–126)
Anion gap: 12 (ref 5–15)
BUN: 13 mg/dL (ref 6–20)
CO2: 22 mmol/L (ref 22–32)
Calcium: 9.4 mg/dL (ref 8.9–10.3)
Chloride: 104 mmol/L (ref 98–111)
Creatinine, Ser: 1.31 mg/dL — ABNORMAL HIGH (ref 0.61–1.24)
GFR, Estimated: >60 mL/min (ref >60)
Glucose, Bld: 107 mg/dL — ABNORMAL HIGH (ref 70–99)
Potassium: 3.8 mmol/L (ref 3.5–5.1)
Sodium: 138 mmol/L (ref 135–145)
Total Bilirubin: 2.1 mg/dL — ABNORMAL HIGH (ref 0.3–1.2)
Total Protein: 7.3 g/dL (ref 6.5–8.1)

## 2021-07-05 LAB — CBC WITH DIFFERENTIAL/PLATELET
Abs Immature Granulocytes: 0.05 10*3/uL (ref 0.00–0.07)
Basophils Absolute: 0.1 10*3/uL (ref 0.0–0.1)
Basophils Relative: 1 %
Eosinophils Absolute: 0.2 10*3/uL (ref 0.0–0.5)
Eosinophils Relative: 1 %
HCT: 48.3 % (ref 39.0–52.0)
Hemoglobin: 17.2 g/dL — ABNORMAL HIGH (ref 13.0–17.0)
Immature Granulocytes: 0 %
Lymphocytes Relative: 27 %
Lymphs Abs: 3.7 10*3/uL (ref 0.7–4.0)
MCH: 29.8 pg (ref 26.0–34.0)
MCHC: 35.6 g/dL (ref 30.0–36.0)
MCV: 83.6 fL (ref 80.0–100.0)
Monocytes Absolute: 1.5 10*3/uL — ABNORMAL HIGH (ref 0.1–1.0)
Monocytes Relative: 11 %
Neutro Abs: 8.4 10*3/uL — ABNORMAL HIGH (ref 1.7–7.7)
Neutrophils Relative %: 60 %
Platelets: 383 10*3/uL (ref 150–400)
RBC: 5.78 MIL/uL (ref 4.22–5.81)
RDW: 12.5 % (ref 11.5–15.5)
WBC: 13.9 10*3/uL — ABNORMAL HIGH (ref 4.0–10.5)
nRBC: 0 % (ref 0.0–0.2)

## 2021-07-05 LAB — RESP PANEL BY RT-PCR (FLU A&B, COVID) ARPGX2
Influenza A by PCR: NEGATIVE
Influenza B by PCR: NEGATIVE
SARS Coronavirus 2 by RT PCR: NEGATIVE

## 2021-07-05 LAB — LIPASE, BLOOD: Lipase: 35 U/L (ref 11–51)

## 2021-07-05 MED ORDER — HYDROMORPHONE HCL 1 MG/ML IJ SOLN
1.0000 mg | Freq: Once | INTRAMUSCULAR | Status: AC
  Administered 2021-07-05: 1 mg via INTRAVENOUS
  Filled 2021-07-05: qty 1

## 2021-07-05 MED ORDER — DOXYLAMINE SUCCINATE (SLEEP) 25 MG PO TABS
50.0000 mg | ORAL_TABLET | Freq: Every evening | ORAL | Status: DC | PRN
  Administered 2021-07-05: 50 mg via ORAL
  Filled 2021-07-05 (×3): qty 2

## 2021-07-05 MED ORDER — POTASSIUM CHLORIDE IN NACL 20-0.9 MEQ/L-% IV SOLN
INTRAVENOUS | Status: DC
  Filled 2021-07-05 (×3): qty 1000

## 2021-07-05 MED ORDER — MORPHINE SULFATE (PF) 4 MG/ML IV SOLN
4.0000 mg | Freq: Once | INTRAVENOUS | Status: AC
  Administered 2021-07-05: 4 mg via INTRAVENOUS
  Filled 2021-07-05: qty 1

## 2021-07-05 MED ORDER — ACETAMINOPHEN 650 MG RE SUPP: 650.0000 mg | Freq: Four times a day (QID) | RECTAL | Status: DC | PRN

## 2021-07-05 MED ORDER — ONDANSETRON HCL 4 MG/2ML IJ SOLN
4.0000 mg | Freq: Four times a day (QID) | INTRAMUSCULAR | Status: DC | PRN
  Administered 2021-07-05: 4 mg via INTRAVENOUS
  Filled 2021-07-05: qty 2

## 2021-07-05 MED ORDER — ONDANSETRON HCL 4 MG/2ML IJ SOLN
4.0000 mg | Freq: Once | INTRAMUSCULAR | Status: AC
  Administered 2021-07-05: 4 mg via INTRAVENOUS
  Filled 2021-07-05: qty 2

## 2021-07-05 MED ORDER — ACETAMINOPHEN 325 MG PO TABS: 650.0000 mg | ORAL_TABLET | Freq: Four times a day (QID) | ORAL | Status: DC | PRN

## 2021-07-05 MED ORDER — PANTOPRAZOLE SODIUM 40 MG IV SOLR
40.0000 mg | Freq: Every day | INTRAVENOUS | Status: DC
  Administered 2021-07-05 – 2021-07-06 (×2): 40 mg via INTRAVENOUS
  Filled 2021-07-05 (×2): qty 10

## 2021-07-05 MED ORDER — LACTATED RINGERS IV BOLUS
1000.0000 mL | Freq: Once | INTRAVENOUS | Status: AC
  Administered 2021-07-05: 1000 mL via INTRAVENOUS

## 2021-07-05 MED ORDER — IOHEXOL 300 MG/ML  SOLN
100.0000 mL | Freq: Once | INTRAMUSCULAR | Status: AC | PRN
  Administered 2021-07-05: 100 mL via INTRAVENOUS

## 2021-07-05 MED ORDER — LORAZEPAM 2 MG/ML IJ SOLN: 1.0000 mg | Freq: Once | INTRAMUSCULAR | Status: DC

## 2021-07-05 MED ORDER — HYDROMORPHONE HCL 1 MG/ML IJ SOLN
0.7500 mg | INTRAMUSCULAR | Status: DC | PRN
  Administered 2021-07-05: 0.75 mg via INTRAVENOUS
  Filled 2021-07-05: qty 1

## 2021-07-05 MED ORDER — LACTATED RINGERS IV BOLUS: 1000.0000 mL | Freq: Once | INTRAVENOUS | Status: AC

## 2021-07-05 MED ORDER — ONDANSETRON HCL 4 MG PO TABS: 4.0000 mg | ORAL_TABLET | Freq: Four times a day (QID) | ORAL | Status: DC | PRN

## 2021-07-05 MED ORDER — SODIUM CHLORIDE 0.9 % IV BOLUS
1000.0000 mL | Freq: Once | INTRAVENOUS | Status: AC
  Administered 2021-07-05: 1000 mL via INTRAVENOUS

## 2021-07-05 NOTE — ED Triage Notes (Signed)
Patient reports abdominal pain x2 hours. Hx Crohns with bowel resection and obstruction.

## 2021-07-05 NOTE — H&P (Signed)
History and Physical    Patient: Anthony Reed YQM:[id number] DOB: 1986/05/06 DOA: 07/05/2021 DOS: the patient was seen and examined on 07/05/2021 PCP: Vivi Barrack, MD  Patient coming from: Home  Chief Complaint:  Chief Complaint  Patient presents with   Abdominal Pain    HPI: Anthony Reed is a 36 y.o. male with medical history significant of seasonal allergies, Crohn's disease with history of small bowel resection, history of small bowel obstructions,  GERD, Eliberto Ivory Parkinson's White pattern who is coming to the emergency department with complaints of abdominal pain associated with distention, nausea and 5 episodes of emesis since around 10 in the morning.  He stated that yesterday was his wedding anniversary.  He went out to dinner with his wife.  He believes that he probably ate and drank a little bit more than usual.  His last bowel movement was this morning.  He is not currently passing flatus, but stated he feels better after vomiting.  His distention has decreased..  No constipation, melena or hematochezia.  No flank pain, dysuria, frequency or hematuria.  No fever, chills, diaphoresis, rhinorrhea, wheezing, dyspnea or hemoptysis.  His throat is a little sore from vomiting.  No chest pain, palpitations, diaphoresis, PND, orthopnea or pitting edema of the lower extremities.  No polyuria, polydipsia, polyphagia or blurred vision.  ED course: Initial vital signs were temperature 97.4 F pulse 98, respiration 20, BP 150/126 mmHg O2 sat 95% on room air.  He agrees to see LR 2000 mL bolus, morphine 4 mg IVP x1 hydromorphone 1 mg IVP x1 and ondansetron 4 mg IVP x1.  Lab work: His urinalysis showed a specific gravity of 1.040 and ketonuria 5 mg/dL.  CBC showed a white count of 13.9, hemoglobin 17.2 g/dL (baseline around 14 g/dL) and platelet count 383.  CMP showed a glucose of 107, creatinine 1.31 and total bilirubin 2.1 mg/dL.  Imaging: Abdominal x-ray shows partial SBO.  No free air.  No active  cardiopulmonary disease on chest radiograph.  CT abdomen/pelvis with contrast showed small bowel obstruction in the right lower quadrant.  Please see images and full radiology report for further details.  Review of Systems: As mentioned in the history of present illness. All other systems reviewed and are negative. Past Medical History:  Diagnosis Date   Allergy    Crohn disease ([place])    Crohn disease ([place])    GERD (gastroesophageal reflux disease)    Wolff-Parkinson-White (WPW) pattern    Past Surgical History:  Procedure Laterality Date   ANKLE SURGERY Right    BOWEL RESECTION  2016   Small bowel   COLONOSCOPY     with purposed inflation to improve area that gets "SBO kink".   Social History:  reports that he quit smoking about 12 years ago. His smoking use included cigarettes. He has never used smokeless tobacco. He reports current alcohol use. He reports that he does not use drugs.  No Known Allergies  Family History  Problem Relation Age of Onset   Thyroid disease Mother    Asthma Father    Hyperlipidemia Father    Hypertension Father    Early death Maternal Grandfather    Cancer Maternal Grandfather 47   COPD Paternal Grandmother    Heart disease Paternal Grandfather    Hyperlipidemia Paternal Grandfather    Hypertension Paternal Grandfather    Kidney disease Paternal Grandfather    Asthma Sister    Colon cancer Neg Hx    Prostate cancer Neg Hx  Prior to Admission medications   Medication Sig Start Date End Date Taking? Authorizing Provider  acetaminophen (TYLENOL) 500 MG tablet Take 500 mg by mouth every 6 (six) hours as needed for moderate pain, mild pain, fever or headache.   Yes [provider]  Cholecalciferol (VITAMIN D3 PO) Take 1 capsule by mouth daily.   Yes [provider]  ferrous sulfate 325 (65 FE) MG tablet Take 325 mg by mouth daily with breakfast.   Yes [provider]  loratadine (CLARITIN) 10 MG tablet Take 10 mg by  mouth daily as needed for itching, rhinitis or allergies.   Yes [provider]  Multiple Vitamin (THERA) TABS Take 1 tablet by mouth daily.   Yes [provider]  Omega-3 Fatty Acids (FISH OIL PO) Take 1 capsule by mouth daily.   Yes [provider]  Probiotic Product (PROBIOTIC PO) Take 1 tablet by mouth daily.   Yes [provider]  ustekinumab (STELARA) 90 MG/ML SOSY injection Inject 90 mg into the skin See admin instructions. INJECT 1 SYRINGE UNDER THE SKIN ONCE EVERY 8 WEEKS. REFRIGERATE. DO NOT FREEZE. 03/19/20  Yes [provider]    Physical Exam: Vitals:   07/05/21 1219 07/05/21 1330 07/05/21 1428  BP: (!) 150/126 121/75 117/68  Pulse: 98 63 68  Resp: 20 20 18   Temp: (!) 97.4 F (36.3 C)    SpO2: 95% 90% 96%   Physical Exam Vitals and nursing note reviewed.  Constitutional:      Appearance: He is well-developed and normal weight.  HENT:     Head: Normocephalic.     Mouth/Throat:     Mouth: Mucous membranes are dry.     Pharynx: No oropharyngeal exudate or posterior oropharyngeal erythema.  Eyes:     General: No scleral icterus.    Conjunctiva/sclera: Conjunctivae normal.     Pupils: Pupils are equal, round, and reactive to light.  Neck:     Vascular: No JVD.  Cardiovascular:     Rate and Rhythm: Normal rate and regular rhythm.  Pulmonary:     Effort: Pulmonary effort is normal.     Breath sounds: Normal breath sounds.  Abdominal:     General: Bowel sounds are increased. There is no distension.     Palpations: Abdomen is soft.     Tenderness: There is generalized abdominal tenderness. There is no guarding or rebound.  Genitourinary:    Testes:        Right: Swelling not present.        Left: Swelling not present.  Musculoskeletal:     Cervical back: Neck supple.     Right lower leg: No edema.     Left lower leg: No edema.     Right ankle: Swelling present.     Comments: Mild postop nonpitting right ankle edema.   Skin:    General: Skin is warm and dry.     Coloration: Skin is not jaundiced.  Neurological:     General: No focal deficit present.     Mental Status: He is alert and oriented to person, place, and time.  Psychiatric:        Mood and Affect: Mood normal.        Behavior: Behavior normal.   Data Reviewed:  There are no new results to review at this time.  Assessment and Plan: Principal Problem:   SBO (small bowel obstruction) (HCC) Observation/telemetry. Distention has resolved after vomiting. No further vomiting since early afternoon. We  will defer NGT placement at the moment. Continue IV fluids, analgesics and antiemetics. Pantoprazole 40 mg IVP every 24 hours. General surgery requested GI evaluation. Reconsult general surgery if symptoms recur.  Active Problems:   Volume depletion Resulting in   Polycythemia Continue IV fluids. Follow-up CBC and chemistry in the morning.       History of Crohn's disease [facility] consulted.     Wolff-Parkinson-White (WPW) pattern No difficulty with exertion. Stated he has been cleared by cardiology for most activity. We will continue cardiac telemetry and keep electrolytes optimized.     GERD (gastroesophageal reflux disease) Pantoprazole 40 mg IVP every 24 hours.   Advance Care Planning:   Code Status: Full Code   Consults: [facility] (Dr. Alessandra Bevels). Dr. Harlow Asa told EDP to reconsult if things did not improve with fluids and symptomatic treatment.  Family Communication:   Severity of Illness: The appropriate patient status for this patient is OBSERVATION. Observation status is judged to be reasonable and necessary in order to provide the required intensity of service to ensure the patient's safety. The patient's presenting symptoms, physical exam findings, and initial radiographic and laboratory data in the context of their medical condition is felt to place them at decreased risk for further clinical deterioration.  Furthermore, it is anticipated that the patient will be medically stable for discharge from the hospital within 2 midnights of admission.   Author: Reubin Milan, MD 07/05/2021 4:52 PM  For on call review [email or web address].   This document was prepared with Dragon voice recognition software and may contain some unintended transcription errors.

## 2021-07-05 NOTE — Plan of Care (Signed)
Educating in process. Pt very aware of his condition.

## 2021-07-05 NOTE — ED Provider Notes (Signed)
[facility] DEPT Provider Note   CSN: [id number] Arrival date & time: 07/05/21  1209     History  Chief Complaint  Patient presents with   Abdominal Pain    Anthony Reed is a 36 y.o. male.  The history is provided by the patient and medical records. No language interpreter was used.  Abdominal Pain  This is a 36 year old male with significant history of Crohn's disease and recurrent small bowel obstruction, WPW, GERD, presenting for evaluation of abdominal pain.  Patient reported cute onset of very sharp stabbing epigastric abdominal pain that started approximately 2 hours ago.  Pain is severe, persistent with associated nausea and diaphoresis.  Symptoms felt very similar to prior SBO.  No fever chest pain trouble breathing cough dysuria hematuria.  He is currently on Stelara for Crohn's disease.    Home Medications Prior to Admission medications   Medication Sig Start Date End Date Taking? Authorizing Provider  ferrous sulfate 325 (65 FE) MG tablet Take 325 mg by mouth daily with breakfast.    [provider]  loratadine (CLARITIN) 10 MG tablet Take 10 mg by mouth daily as needed for itching, rhinitis or allergies.    [provider]  Multiple Vitamin (THERA) TABS Take 1 tablet by mouth daily.    [provider]  Omega-3 Fatty Acids (FISH OIL PO) Take 1 capsule by mouth daily.    [provider]  Probiotic Product (PROBIOTIC PO) Take 1 tablet by mouth daily.    [provider]  ustekinumab (STELARA) 90 MG/ML SOSY injection Inject 90 mg into the skin See admin instructions. INJECT 1 SYRINGE UNDER THE SKIN ONCE EVERY 8 WEEKS. REFRIGERATE. DO NOT FREEZE. 03/19/20   [provider]      Allergies    Patient has no known allergies.    Review of Systems   Review of Systems  Gastrointestinal:  Positive for abdominal pain.  All other systems reviewed and are negative.  Physical Exam Updated Vital  Signs BP (!) 150/126    Pulse 98    Temp (!) 97.4 F (36.3 C)    Resp 20    SpO2 95%  Physical Exam Vitals and nursing note reviewed.  Constitutional:      General: He is in acute distress (Patient is laying on the floor, diaphoretic, appears very uncomfortable, dry heaving).     Appearance: He is well-developed.  HENT:     Head: Atraumatic.  Eyes:     Conjunctiva/sclera: Conjunctivae normal.  Cardiovascular:     Rate and Rhythm: Tachycardia present.     Heart sounds: Normal heart sounds.  Pulmonary:     Effort: Pulmonary effort is normal.     Breath sounds: Normal breath sounds.  Abdominal:     General: Bowel sounds are decreased.     Tenderness: There is abdominal tenderness (Diffuse abdominal tenderness with guarding noted.).  Musculoskeletal:     Cervical back: Neck supple.  Skin:    Findings: No rash.  Neurological:     Mental Status: He is alert.    ED Results / Procedures / Treatments   Labs (all labs ordered are listed, but only abnormal results are displayed) Labs Reviewed  CBC WITH DIFFERENTIAL/PLATELET - Abnormal; Notable for the following components:      Result Value   WBC 13.9 (*)    Hemoglobin 17.2 (*)    Neutro Abs 8.4 (*)    Monocytes Absolute 1.5 (*)    All other components  within normal limits  COMPREHENSIVE METABOLIC PANEL - Abnormal; Notable for the following components:   Glucose, Bld 107 (*)    Creatinine, Ser 1.31 (*)    Total Bilirubin 2.1 (*)    All other components within normal limits  RESP PANEL BY RT-PCR (FLU A&B, COVID) ARPGX2  LIPASE, BLOOD  URINALYSIS, ROUTINE W REFLEX MICROSCOPIC    EKG None  Radiology DG Abdomen Acute W/Chest  Result Date: 07/05/2021 CLINICAL DATA:  Nausea vomiting.  History of bowel obstruction. EXAM: DG ABDOMEN ACUTE WITH 1 VIEW CHEST COMPARISON:  02/02/2021.  CT, 02/14/2021. FINDINGS: There are dilated loops of small bowel with air-fluid levels on the erect view consistent with a small-bowel obstruction.  Some air is seen within a nondilated colon. No free air. Faint bowel anastomosis staple line suggested in the right mid abdomen. Abdominopelvic soft tissues are otherwise unremarkable. Normal heart, mediastinum and hila.  Clear lungs. Skeletal structures are unremarkable. IMPRESSION: 1. Partial small bowel obstruction.  No free air. 2. No active cardiopulmonary disease. Electronically Signed   By: Lajean Manes M.D.   On: 07/05/2021 12:46    Procedures Procedures    Medications Ordered in ED Medications  HYDROmorphone (DILAUDID) injection 1 mg (1 mg Intravenous Given 07/05/21 1243)  morphine (PF) 4 MG/ML injection 4 mg (4 mg Intravenous Given 07/05/21 1256)  sodium chloride 0.9 % bolus 1,000 mL (1,000 mLs Intravenous New Bag/Given 07/05/21 1243)  ondansetron (ZOFRAN) injection 4 mg (4 mg Intravenous Given 07/05/21 1256)  iohexol (OMNIPAQUE) 300 MG/ML solution 100 mL (100 mLs Intravenous Contrast Given 07/05/21 1338)    ED Course/ Medical Decision Making/ A&P                           Medical Decision Making Amount and/or Complexity of Data Reviewed Labs: ordered. Radiology: ordered.  Risk Prescription drug management.   BP (!) 150/126    Pulse 98    Temp (!) 97.4 F (36.3 C)    Resp 20    SpO2 95%   12:30 PM Patient with significant history of Crohn's disease and history of small bowel obstruction presenting with acute onset of abdominal pain nausea vomiting and felt very similar to prior bowel obstruction that he has had in the past.  Last bowel movement was normal and was this morning.  He appears very uncomfortable, diaphoretic, laying in the prone position on the ground.  We will currently provide opiate pain medication, IV fluid, and work-up initiated.   1:12 PM Patient was probably given opiate pain medication as well as antinausea medication.  IV fluid given.  Labs reviewed and independently interpreted by me, initial x-ray reviewed and independently interpreted by me as  well. Patient does have an elevated white count of 13.9, and acute abdominal series with evidence of small bowel obstruction but no free air.  I appreciate consultation from on-call general surgeon, Dr. Harlow Asa, who felt patient would benefit from pulm further evaluation including out of abdominal pelvis CT scan.  He suspect this is likely to be a medical management and nonsurgical management.  He recommends hospital admission with GI follow-up.  Care discussed with Dr. Ronnald Nian.   2:42 PM Appreciate consultation from [facility] hospitalist, Dr. Olevia Bowens, who agrees to see and will admit patient for further managements of his condition.  At this time his abdominal pelvis CT scan is still currently pending.  However on review reexamination, patient appears to be more comfortable, not actively vomiting, and  is agreeable with plan.  This patient presents to the ED for concern of abd pain, this involves an extensive number of treatment options, and is a complaint that carries with it a high risk of complications and morbidity.  The differential diagnosis includes Crohn's colitis, SBO, pancreatitis, diverticulitis, appendicitis, gastritis, perforated viscous, PTX  Co morbidities that complicate the patient evaluation Crohn's disease  Hx SBO Additional history obtained:  External records from outside source obtained and reviewed including notes from prior ER visits, prior admission, and notes from GI from last year  Lab Tests:  I Ordered, and personally interpreted labs.  The pertinent results include:  WBC 13  Imaging Studies ordered:  I ordered imaging studies including abd/pelvis CT I independently visualized and interpreted imaging which showed SBO localized to RLQ, no free air.   I agree with the radiologist interpretation  Cardiac Monitoring:  The patient was maintained on a cardiac monitor.  I personally viewed and interpreted the cardiac monitored which showed an underlying rhythm of:  NSR  Medicines ordered and prescription drug management:  I ordered medication including dilaudid  for pain control Reevaluation of the patient after these medicines showed that the patient improved I have reviewed the patients home medicines and have made adjustments as needed  Test Considered: as above  Critical Interventions: as above  Consultations Obtained:  I requested consultation with the [facility] Hospitalist Dr. Olevia Bowens,  and discussed lab and imaging findings as well as pertinent plan - they recommend: admission for futher management . Pt refused NG tube and he is currently not vomiting.    Problem List / ED Course: SBO  Reevaluation:  After the interventions noted above, I reevaluated the patient and found that they have :improved  Social Determinants of Health:   Dispostion:  After consideration of the diagnostic results and the patients response to treatment, I feel that the patent would benefit from admission.         Final Clinical Impression(s) / ED Diagnoses Final diagnoses:  SBO (small bowel obstruction) [facility])    Rx / DC Orders ED Discharge Orders     None         Domenic Moras, PA-C 07/05/21 1456    Lennice Sites, DO 07/06/21 1029

## 2021-07-05 NOTE — ED Notes (Signed)
Pt is aware that urine needed, urinal at bedside.

## 2021-07-05 NOTE — Progress Notes (Signed)
Pt has arrived to Room 1506. Alert and oriented. Walking around room.  Bathroom used.

## 2021-07-06 DIAGNOSIS — I456 Pre-excitation syndrome: Secondary | ICD-10-CM | POA: Diagnosis present

## 2021-07-06 DIAGNOSIS — T183XXD Foreign body in small intestine, subsequent encounter: Secondary | ICD-10-CM | POA: Diagnosis not present

## 2021-07-06 DIAGNOSIS — Z87891 Personal history of nicotine dependence: Secondary | ICD-10-CM | POA: Diagnosis not present

## 2021-07-06 DIAGNOSIS — D72829 Elevated white blood cell count, unspecified: Secondary | ICD-10-CM | POA: Diagnosis present

## 2021-07-06 DIAGNOSIS — K219 Gastro-esophageal reflux disease without esophagitis: Secondary | ICD-10-CM | POA: Diagnosis present

## 2021-07-06 DIAGNOSIS — D751 Secondary polycythemia: Secondary | ICD-10-CM | POA: Diagnosis present

## 2021-07-06 DIAGNOSIS — Z8249 Family history of ischemic heart disease and other diseases of the circulatory system: Secondary | ICD-10-CM | POA: Diagnosis not present

## 2021-07-06 DIAGNOSIS — K50912 Crohn's disease, unspecified, with intestinal obstruction: Secondary | ICD-10-CM | POA: Diagnosis present

## 2021-07-06 DIAGNOSIS — X58XXXD Exposure to other specified factors, subsequent encounter: Secondary | ICD-10-CM | POA: Diagnosis present

## 2021-07-06 DIAGNOSIS — Z79899 Other long term (current) drug therapy: Secondary | ICD-10-CM | POA: Diagnosis not present

## 2021-07-06 DIAGNOSIS — Z9889 Other specified postprocedural states: Secondary | ICD-10-CM | POA: Diagnosis not present

## 2021-07-06 DIAGNOSIS — Z20822 Contact with and (suspected) exposure to covid-19: Secondary | ICD-10-CM | POA: Diagnosis present

## 2021-07-06 DIAGNOSIS — Z9049 Acquired absence of other specified parts of digestive tract: Secondary | ICD-10-CM | POA: Diagnosis not present

## 2021-07-06 DIAGNOSIS — Z8349 Family history of other endocrine, nutritional and metabolic diseases: Secondary | ICD-10-CM | POA: Diagnosis not present

## 2021-07-06 DIAGNOSIS — E869 Volume depletion, unspecified: Secondary | ICD-10-CM | POA: Diagnosis present

## 2021-07-06 DIAGNOSIS — Z83438 Family history of other disorder of lipoprotein metabolism and other lipidemia: Secondary | ICD-10-CM | POA: Diagnosis not present

## 2021-07-06 DIAGNOSIS — Z825 Family history of asthma and other chronic lower respiratory diseases: Secondary | ICD-10-CM | POA: Diagnosis not present

## 2021-07-06 DIAGNOSIS — K56609 Unspecified intestinal obstruction, unspecified as to partial versus complete obstruction: Secondary | ICD-10-CM | POA: Diagnosis present

## 2021-07-06 LAB — COMPREHENSIVE METABOLIC PANEL
ALT: 27 U/L (ref 0–44)
AST: 21 U/L (ref 15–41)
Albumin: 3.1 g/dL — ABNORMAL LOW (ref 3.5–5.0)
Alkaline Phosphatase: 57 U/L (ref 38–126)
Anion gap: 3 — ABNORMAL LOW (ref 5–15)
BUN: 11 mg/dL (ref 6–20)
CO2: 25 mmol/L (ref 22–32)
Calcium: 8.1 mg/dL — ABNORMAL LOW (ref 8.9–10.3)
Chloride: 111 mmol/L (ref 98–111)
Creatinine, Ser: 1.15 mg/dL (ref 0.61–1.24)
GFR, Estimated: >60 mL/min (ref >60)
Glucose, Bld: 89 mg/dL (ref 70–99)
Potassium: 4.3 mmol/L (ref 3.5–5.1)
Sodium: 139 mmol/L (ref 135–145)
Total Bilirubin: 3.2 mg/dL — ABNORMAL HIGH (ref 0.3–1.2)
Total Protein: 5.2 g/dL — ABNORMAL LOW (ref 6.5–8.1)

## 2021-07-06 LAB — CBC
HCT: 41.1 % (ref 39.0–52.0)
Hemoglobin: 14 g/dL (ref 13.0–17.0)
MCH: 30.2 pg (ref 26.0–34.0)
MCHC: 34.1 g/dL (ref 30.0–36.0)
MCV: 88.6 fL (ref 80.0–100.0)
Platelets: 229 10*3/uL (ref 150–400)
RBC: 4.64 MIL/uL (ref 4.22–5.81)
RDW: 12.9 % (ref 11.5–15.5)
WBC: 5.6 10*3/uL (ref 4.0–10.5)
nRBC: 0 % (ref 0.0–0.2)

## 2021-07-06 MED ORDER — PREDNISONE 10 MG PO TABS: ORAL_TABLET | ORAL | 0 refills | Status: DC

## 2021-07-06 NOTE — Consult Note (Signed)
Referring Provider: [facility] Primary Care Physician:  Vivi Barrack, MD Primary Gastroenterologist:  Althia Forts ([facility])  Reason for Consultation:  SBO, Crohn's  HPI: Anthony Reed is a 36 y.o. male  with medical history significant of seasonal allergies, Crohn's disease with prior robotic ileocecectomy with small bowel resections x2 in 2017, is on Stelara, followed by Dr. Shary Key at [place],  GERD, [place] who presents for evaluation of SBO.  History of SBO 01/2021, managed with conservative treatment and patient left AMA. Admission 02/2021 for dehydration secondary to AKI in the setting of Crohn's disease.  Patient originally presented to ED with abdominal pain/distention, nausea, and vomiting. Abdominal xray showed partial small bowel obstruction. CT ab pelvis with contrast showed SBO localized to the RLQ. No free air, small amount of ascites, foreign body noted in ventral small bowel loops. Numerous bowel anastomoses.   Patient states due to his frequent small bowel obstructions, he can feel when it begins to come on and typically when it does he will stop eating and it will resolve.  States after colonoscopy 03/02/2021 where he had anastomoses dilated, he was feeling much better until January 2023 when he noticed he was developing a small bowel obstruction.  He stopped eating at this time and it resolved on its own with no need for hospitalization. Patient states today he is feeling great. States his abdominal pain, nausea, and vomiting have resolved. And he had a medium sized formed BM this morning. States his last dose of Stelara was January 31st, 2023.  Last colonoscopy 03/02/2021: showed anastomosis that was dilated.  Past Medical History:  Diagnosis Date   Allergy    Crohn disease ([place])    Crohn disease ([place])    GERD (gastroesophageal reflux disease)    Wolff-Parkinson-White (WPW) pattern     Past Surgical History:  Procedure Laterality Date    ANKLE SURGERY Right    BOWEL RESECTION  2016   Small bowel   COLONOSCOPY     with purposed inflation to improve area that gets "SBO kink".    Prior to Admission medications   Medication Sig Start Date End Date Taking? Authorizing Provider  acetaminophen (TYLENOL) 500 MG tablet Take 500 mg by mouth every 6 (six) hours as needed for moderate pain, mild pain, fever or headache.   Yes [provider]  Cholecalciferol (VITAMIN D3 PO) Take 1 capsule by mouth daily.   Yes [provider]  ferrous sulfate 325 (65 FE) MG tablet Take 325 mg by mouth daily with breakfast.   Yes [provider]  loratadine (CLARITIN) 10 MG tablet Take 10 mg by mouth daily as needed for itching, rhinitis or allergies.   Yes [provider]  Multiple Vitamin (THERA) TABS Take 1 tablet by mouth daily.   Yes [provider]  Omega-3 Fatty Acids (FISH OIL PO) Take 1 capsule by mouth daily.   Yes [provider]  Probiotic Product (PROBIOTIC PO) Take 1 tablet by mouth daily.   Yes [provider]  ustekinumab (STELARA) 90 MG/ML SOSY injection Inject 90 mg into the skin See admin instructions. INJECT 1 SYRINGE UNDER THE SKIN ONCE EVERY 8 WEEKS. REFRIGERATE. DO NOT FREEZE. 03/19/20  Yes [provider]    Scheduled Meds:  pantoprazole (PROTONIX) IV  40 mg Intravenous Daily   Continuous Infusions:  0.9 % NaCl with KCl 20 mEq / L 125 mL/hr at 07/05/21 2345   PRN Meds:.acetaminophen **OR** acetaminophen, doxylamine (Sleep), HYDROmorphone (DILAUDID) injection,  ondansetron **OR** ondansetron (ZOFRAN) IV  Allergies as of 07/05/2021   (No Known Allergies)    Family History  Problem Relation Age of Onset   Thyroid disease Mother    Asthma Father    Hyperlipidemia Father    Hypertension Father    Early death Maternal Grandfather    Cancer Maternal Grandfather 39   COPD Paternal Grandmother    Heart disease Paternal Grandfather    Hyperlipidemia  Paternal Grandfather    Hypertension Paternal Grandfather    Kidney disease Paternal Grandfather    Asthma Sister    Colon cancer Neg Hx    Prostate cancer Neg Hx     Social History   Socioeconomic History   Marital status: Married    Spouse name: Not on file   Number of children: Not on file   Years of education: Not on file   Highest education level: Not on file  Occupational History   Not on file  Tobacco Use   Smoking status: Former    Types: Cigarettes    Quit date: 12/11/2008    Years since quitting: 12.5   Smokeless tobacco: Never  Vaping Use   Vaping Use: Never used  Substance and Sexual Activity   Alcohol use: Yes    Alcohol/week: 0.0 standard drinks   Drug use: Never   Sexual activity: Yes  Other Topics Concern   Not on file  Social History Narrative   Not on file   Social Determinants of Health   Financial Resource Strain: Not on file  Food Insecurity: Not on file  Transportation Needs: Not on file  Physical Activity: Not on file  Stress: Not on file  Social Connections: Not on file  Intimate Partner Violence: Not on file    Review of Systems: Review of Systems  Constitutional:  Negative for chills and fever.  HENT:  Negative for hearing loss and tinnitus.   Eyes:  Negative for blurred vision and double vision.  Respiratory:  Negative for cough and hemoptysis.   Cardiovascular:  Negative for chest pain and palpitations.  Gastrointestinal:  Positive for abdominal pain, nausea and vomiting. Negative for blood in stool, constipation, diarrhea, heartburn and melena.  Genitourinary:  Negative for dysuria and urgency.  Musculoskeletal:  Negative for myalgias and neck pain.  Skin:  Negative for rash.  Neurological:  Negative for seizures and loss of consciousness.  Psychiatric/Behavioral:  Negative for substance abuse. The patient is not nervous/anxious.     Physical Exam:Physical Exam Constitutional:      Appearance: Normal appearance.  HENT:      Head: Normocephalic and atraumatic.     Nose: Nose normal. No congestion.     Mouth/Throat:     Mouth: Mucous membranes are moist.     Pharynx: Oropharynx is clear.  Eyes:     Extraocular Movements: Extraocular movements intact.     Conjunctiva/sclera: Conjunctivae normal.  Cardiovascular:     Rate and Rhythm: Normal rate and regular rhythm.  Pulmonary:     Effort: Pulmonary effort is normal. No respiratory distress.  Abdominal:     General: Abdomen is flat. Bowel sounds are normal. There is no distension.     Palpations: Abdomen is soft. There is no mass.     Tenderness: There is no abdominal tenderness. There is no guarding or rebound.     Hernia: No hernia is present.  Musculoskeletal:        General: No swelling. Normal range of motion.  Cervical back: Normal range of motion and neck supple.  Skin:    General: Skin is warm and dry.  Neurological:     General: No focal deficit present.     Mental Status: He is alert and oriented to person, place, and time.  Psychiatric:        Mood and Affect: Mood normal.        Behavior: Behavior normal.        Thought Content: Thought content normal.        Judgment: Judgment normal.    Vital signs: Vitals:   07/05/21 2019 07/06/21 0237  BP: 123/81 105/77  Pulse: (!) 51 (!) 52  Resp: 16 16  Temp: (!) 97.4 F (36.3 C) 97.8 F (36.6 C)  SpO2: 100% 98%   Last BM Date : 07/05/21    GI:  Lab Results: Recent Labs    07/05/21 1220  WBC 13.9*  HGB 17.2*  HCT 48.3  PLT 383   BMET Recent Labs    07/05/21 1220  NA 138  K 3.8  CL 104  CO2 22  GLUCOSE 107*  BUN 13  CREATININE 1.31*  CALCIUM 9.4   LFT Recent Labs    07/05/21 1220  PROT 7.3  ALBUMIN 4.4  AST 37  ALT 41  ALKPHOS 74  BILITOT 2.1*   PT/INR No results for input(s): LABPROT, INR in the last 72 hours.   Studies/Results: CT ABDOMEN PELVIS W CONTRAST  Result Date: 07/05/2021 CLINICAL DATA:  Bowel obstruction suspected. EXAM: CT ABDOMEN AND  PELVIS WITH CONTRAST TECHNIQUE: Multidetector CT imaging of the abdomen and pelvis was performed using the standard protocol following bolus administration of intravenous contrast. RADIATION DOSE REDUCTION: This exam was performed according to the departmental dose-optimization program which includes automated exposure control, adjustment of the mA and/or kV according to patient size and/or use of iterative reconstruction technique. CONTRAST:  163m OMNIPAQUE IOHEXOL 300 MG/ML  SOLN COMPARISON:  02/14/2021 FINDINGS: Lower chest: No acute abnormality. Hepatobiliary: No focal liver abnormality is seen. No radiopaque gallstones, biliary dilatation, or pericholecystic inflammatory changes. Pancreas: Unremarkable. No pancreatic ductal dilatation or surrounding inflammatory changes. Spleen: Normal in size without focal abnormality. Adrenals/Urinary Tract: Adrenal glands are unremarkable. Kidneys are normal, without renal calculi, focal lesion, or hydronephrosis. Bladder is unremarkable. Stomach/Bowel: Small hiatal hernia. Stomach is otherwise normal in appearance. Proximal small bowel loops are normal caliber. There is significant dilatation of mid small bowel loops. Numerous surgical anastomoses are identified. Stable appearance of ileocecal anastomosis. Transition zone of dilated bowel to normal bowel is within the RIGHT LOWER QUADRANT, possibly at a surgical site on image 57 of series 2. There is no free intraperitoneal air. Oval radiopaque intraluminal foreign body is again noted, today within a central dilated loops of small bowel. Colon is unremarkable. Vascular/Lymphatic: Central and RIGHT LOWER QUADRANT mesenteric lymph nodes are mildly prominent, measuring less than 1 centimeter in short axis. Vessels are unremarkable. Reproductive: Prostate is unremarkable. Other: Postoperative changes in the anterior abdominal wall. Small amount of ascites. Musculoskeletal: No acute or significant osseous findings. IMPRESSION:  1. Small bowel obstruction, localized to the RIGHT LOWER QUADRANT. 2. No free intraperitoneal air. 3. Small amount of ascites. 4. Oval intraluminal radiopaque foreign body again noted within the central small bowel loops. 5. Numerous bowel anastomoses. Electronically Signed   By: ENolon NationsM.D.   On: 07/05/2021 14:49   DG Abdomen Acute W/Chest  Result Date: 07/05/2021 CLINICAL DATA:  Nausea vomiting.  History of bowel  obstruction. EXAM: DG ABDOMEN ACUTE WITH 1 VIEW CHEST COMPARISON:  02/02/2021.  CT, 02/14/2021. FINDINGS: There are dilated loops of small bowel with air-fluid levels on the erect view consistent with a small-bowel obstruction. Some air is seen within a nondilated colon. No free air. Faint bowel anastomosis staple line suggested in the right mid abdomen. Abdominopelvic soft tissues are otherwise unremarkable. Normal heart, mediastinum and hila.  Clear lungs. Skeletal structures are unremarkable. IMPRESSION: 1. Partial small bowel obstruction.  No free air. 2. No active cardiopulmonary disease. Electronically Signed   By: Lajean Manes M.D.   On: 07/05/2021 12:46    Impression: Small bowel obstruction - CT ab pelvis with contrast 2/26: SBO localized to the RLQ. No free air, small amount of ascites, foreign body noted in ventral small bowel loops. Numerous bowel anastomoses.  - leukocytosis with wbc 13.9 - potassium 4.0 - sodium 138 - magnesium 1.9  WPW  GERD  Plan: CT ab pelvis shows SBO localized to RLQ and foreign body noted in ventral small bowel loops. Clinically, patient feels improved with resolution of symptoms and had a medium sized formed BM this morning. Continue to maintain magnesium above 2 and potassium at 4-4.5 Defer NGT placement Continue IVF [facility] will follow   LOS: 0 days   Jiro Kiester Radford Pax  PA-C 07/06/2021, 8:02 AM  Contact #  [phone]

## 2021-07-06 NOTE — TOC Transition Note (Signed)
Transition of Care [facility]) - CM/SW Discharge Note   Patient Details  Name: Kurt Hoffmeier MRN: [id number] Date of Birth: May 07, 1986  Transition of Care [facility]) CM/SW Contact:  Ross Ludwig, LCSW Phone Number: 07/06/2021, 3:11 PM   Clinical Narrative:      Transition of Care [facility]) Screening Note   Patient Details  Name: Dquan Cortopassi Date of Birth: 29-Jul-1985   Transition of Care [facility]) CM/SW Contact:    Ross Ludwig, LCSW Phone Number: 07/06/2021, 3:11 PM    Transition of Care Department [facility]) has reviewed patient and no TOC needs have been identified at this time. We will continue to monitor patient advancement through interdisciplinary progression rounds. If new patient transition needs arise, please place a TOC consult.           Patient Goals and CMS Choice        Discharge Placement                       Discharge Plan and Services                                     Social Determinants of Health (SDOH) Interventions     Readmission Risk Interventions No flowsheet data found.

## 2021-07-08 NOTE — Discharge Summary (Signed)
Physician Discharge Summary   Patient: Anthony Reed MRN: [id number] DOB: 1985-11-26  Admit date:     07/05/2021  Discharge date: 07/06/2021  Discharge Physician: Berle Mull  PCP: Vivi Barrack, MD  Recommendations at discharge: Follow-up with GI as recommended Follow-up with general surgery as recommended  Discharge Diagnoses: Principal Problem:   SBO (small bowel obstruction) ([place]) Active Problems:   Crohn's disease with complication ([place])   Wolff-Parkinson-White (WPW) pattern   GERD (gastroesophageal reflux disease)   Volume depletion   Polycythemia   Hospital Course: Presents with nausea vomiting and abdominal pain.  With small bowel obstruction.  Symptoms resolved next day with patient able to tolerate liquid diet  Assessment and Plan: Small bowel obstruction. Crohn's disease without complication. Prior history of abdominal surgery Patient did not have any vomiting since arrival to the ER. Currently no nausea no abdominal pain. Had a 2 bowel movement and passing gas. Ambulating in the hallway. Initiated on liquid diet and is able to tolerate it without any vomiting. Would like to be discharged today. GI also agrees that the patient can be discharged from their perspective. GI recommends to initiate the patient on prednisone taper which was prescribed. We will continue full liquid diet for another 2 days followed by gradual advancement as tolerated by the patient.  Prior history of abdominal surgery. Questionable abdominal foreign body in the small bowel. This appears to be present since September 2022. Repeat CT scan on October 2022 also confirms the present as well as a CT scan during this admission. Patient is not aware of this finding. Patient was recommended to follow-up with the surgical team who operated in the past.  GERD. Continue PPI.  Consultants: [facility] Procedures performed:  none DISCHARGE MEDICATION: Allergies as of 07/06/2021    No Known Allergies      Medication List     TAKE these medications    acetaminophen 500 MG tablet Commonly known as: TYLENOL Take 500 mg by mouth every 6 (six) hours as needed for moderate pain, mild pain, fever or headache.   ferrous sulfate 325 (65 FE) MG tablet Take 325 mg by mouth daily with breakfast.   FISH OIL PO Take 1 capsule by mouth daily.   loratadine 10 MG tablet Commonly known as: CLARITIN Take 10 mg by mouth daily as needed for itching, rhinitis or allergies.   predniSONE 10 MG tablet Commonly known as: DELTASONE Take 57m daily for 7 days,Take 412mdaily for 7 days,Take 3054maily for 7 days,Take 76m18mily for 7 days,Take 10mg21mly for 7 days, then stop   PROBIOTIC PO Take 1 tablet by mouth daily.   Stelara 90 MG/ML Sosy injection Generic drug: ustekinumab Inject 90 mg into the skin See admin instructions. INJECT 1 SYRINGE UNDER THE SKIN ONCE EVERY 8 WEEKS. REFRIGERATE. DO NOT FREEZE.   Thera Tabs Take 1 tablet by mouth daily.   VITAMIN D3 PO Take 1 capsule by mouth daily.        Follow-up Information     ParkeVivi Barrack Schedule an appointment as soon as possible for a visit in 2 week(s).   Specialty: Family Medicine Contact information: [phone]           Disposition: Home Diet recommendation:  Discharge Diet Orders (From admission, onward)     Start     Ordered   07/06/21 0000  Diet - low sodium heart healthy  07/06/21 1439   07/06/21 0000  Diet full liquid        07/06/21 1439           Discharge Exam: Filed Weights   07/05/21 1838  Weight: 90.7 kg   General: Appear in no distress; no visible Abnormal Neck Mass Or lumps, Conjunctiva normal Cardiovascular: S1 and S2 Present, no Murmur, Respiratory: good respiratory effort, Bilateral Air entry present and CTA, no Crackles, no wheezes Abdomen: Bowel Sound present, no tenderness Extremities: no Pedal  edema Neurology: alert and oriented to time, place, and person Gait not checked due to patient safety concerns   Condition at discharge: good  The results of significant diagnostics from this hospitalization (including imaging, microbiology, ancillary and laboratory) are listed below for reference.   Imaging Studies: CT ABDOMEN PELVIS W CONTRAST  Result Date: 07/05/2021 CLINICAL DATA:  Bowel obstruction suspected. EXAM: CT ABDOMEN AND PELVIS WITH CONTRAST TECHNIQUE: Multidetector CT imaging of the abdomen and pelvis was performed using the standard protocol following bolus administration of intravenous contrast. RADIATION DOSE REDUCTION: This exam was performed according to the departmental dose-optimization program which includes automated exposure control, adjustment of the mA and/or kV according to patient size and/or use of iterative reconstruction technique. CONTRAST:  173m OMNIPAQUE IOHEXOL 300 MG/ML  SOLN COMPARISON:  02/14/2021 FINDINGS: Lower chest: No acute abnormality. Hepatobiliary: No focal liver abnormality is seen. No radiopaque gallstones, biliary dilatation, or pericholecystic inflammatory changes. Pancreas: Unremarkable. No pancreatic ductal dilatation or surrounding inflammatory changes. Spleen: Normal in size without focal abnormality. Adrenals/Urinary Tract: Adrenal glands are unremarkable. Kidneys are normal, without renal calculi, focal lesion, or hydronephrosis. Bladder is unremarkable. Stomach/Bowel: Small hiatal hernia. Stomach is otherwise normal in appearance. Proximal small bowel loops are normal caliber. There is significant dilatation of mid small bowel loops. Numerous surgical anastomoses are identified. Stable appearance of ileocecal anastomosis. Transition zone of dilated bowel to normal bowel is within the RIGHT LOWER QUADRANT, possibly at a surgical site on image 57 of series 2. There is no free intraperitoneal air. Oval radiopaque intraluminal foreign body is again  noted, today within a central dilated loops of small bowel. Colon is unremarkable. Vascular/Lymphatic: Central and RIGHT LOWER QUADRANT mesenteric lymph nodes are mildly prominent, measuring less than 1 centimeter in short axis. Vessels are unremarkable. Reproductive: Prostate is unremarkable. Other: Postoperative changes in the anterior abdominal wall. Small amount of ascites. Musculoskeletal: No acute or significant osseous findings. IMPRESSION: 1. Small bowel obstruction, localized to the RIGHT LOWER QUADRANT. 2. No free intraperitoneal air. 3. Small amount of ascites. 4. Oval intraluminal radiopaque foreign body again noted within the central small bowel loops. 5. Numerous bowel anastomoses. Electronically Signed   By: ENolon NationsM.D.   On: 07/05/2021 14:49   DG Abdomen Acute W/Chest  Result Date: 07/05/2021 CLINICAL DATA:  Nausea vomiting.  History of bowel obstruction. EXAM: DG ABDOMEN ACUTE WITH 1 VIEW CHEST COMPARISON:  02/02/2021.  CT, 02/14/2021. FINDINGS: There are dilated loops of small bowel with air-fluid levels on the erect view consistent with a small-bowel obstruction. Some air is seen within a nondilated colon. No free air. Faint bowel anastomosis staple line suggested in the right mid abdomen. Abdominopelvic soft tissues are otherwise unremarkable. Normal heart, mediastinum and hila.  Clear lungs. Skeletal structures are unremarkable. IMPRESSION: 1. Partial small bowel obstruction.  No free air. 2. No active cardiopulmonary disease. Electronically Signed   By: DLajean ManesM.D.   On: 07/05/2021 12:46    Microbiology: Results for orders  placed or performed during the hospital encounter of 07/05/21  Resp Panel by RT-PCR (Flu A&B, Covid) Nasopharyngeal Swab     Status: None   Collection Time: 07/05/21  1:30 PM   Specimen: Nasopharyngeal Swab; Nasopharyngeal(NP) swabs in vial transport medium  Result Value Ref Range Status   SARS Coronavirus 2 by RT PCR NEGATIVE NEGATIVE Final     Comment: (NOTE) SARS-CoV-2 target nucleic acids are NOT DETECTED.  The SARS-CoV-2 RNA is generally detectable in upper respiratory specimens during the acute phase of infection. The lowest concentration of SARS-CoV-2 viral copies this assay can detect is 138 copies/mL. A negative result does not preclude SARS-Cov-2 infection and should not be used as the sole basis for treatment or other patient management decisions. A negative result may occur with  improper specimen collection/handling, submission of specimen other than nasopharyngeal swab, presence of viral mutation(s) within the areas targeted by this assay, and inadequate number of viral copies(<138 copies/mL). A negative result must be combined with clinical observations, patient history, and epidemiological information. The expected result is Negative.  Fact Sheet for Patients:  EntrepreneurPulse.com.au  Fact Sheet for Healthcare Providers:  IncredibleEmployment.be  This test is no t yet approved or cleared by the [place] FDA and  has been authorized for detection and/or diagnosis of SARS-CoV-2 by FDA under an Emergency Use Authorization (EUA). This EUA will remain  in effect (meaning this test can be used) for the duration of the COVID-19 declaration under Section 564(b)(1) of the Act, 21 U.S.C.section 360bbb-3(b)(1), unless the authorization is terminated  or revoked sooner.       Influenza A by PCR NEGATIVE NEGATIVE Final   Influenza B by PCR NEGATIVE NEGATIVE Final    Comment: (NOTE) The Xpert Xpress SARS-CoV-2/FLU/RSV plus assay is intended as an aid in the diagnosis of influenza from Nasopharyngeal swab specimens and should not be used as a sole basis for treatment. Nasal washings and aspirates are unacceptable for Xpert Xpress SARS-CoV-2/FLU/RSV testing.  Fact Sheet for Patients: EntrepreneurPulse.com.au  Fact Sheet for Healthcare  Providers: IncredibleEmployment.be  This test is not yet approved or cleared by the [place] FDA and has been authorized for detection and/or diagnosis of SARS-CoV-2 by FDA under an Emergency Use Authorization (EUA). This EUA will remain in effect (meaning this test can be used) for the duration of the COVID-19 declaration under Section 564(b)(1) of the Act, 21 U.S.C. section 360bbb-3(b)(1), unless the authorization is terminated or revoked.  Performed at [facility], [place] [address] [place], [place] [address]     Labs: CBC: Recent Labs  Lab 07/05/21 1220 07/06/21 0751  WBC 13.9* 5.6  NEUTROABS 8.4*  --   HGB 17.2* 14.0  HCT 48.3 41.1  MCV 83.6 88.6  PLT 383 342   Basic Metabolic Panel: Recent Labs  Lab 07/05/21 1220 07/06/21 0751  NA 138 139  K 3.8 4.3  CL 104 111  CO2 22 25  GLUCOSE 107* 89  BUN 13 11  CREATININE 1.31* 1.15  CALCIUM 9.4 8.1*   Liver Function Tests: Recent Labs  Lab 07/05/21 1220 07/06/21 0751  AST 37 21  ALT 41 27  ALKPHOS 74 57  BILITOT 2.1* 3.2*  PROT 7.3 5.2*  ALBUMIN 4.4 3.1*   CBG: No results for input(s): GLUCAP in the last 168 hours.  Discharge time spent: greater than 30 minutes.  Signed: Berle Mull, MD [facility] 07/06/2021

## 2021-08-16 NOTE — Discharge Summary (Signed)
 [facility]  Discharge Summary  PCP: Worth Kitty Discharge Details   Admit date:         08/11/2021 Discharge date:         08/16/2021 Hospital Days:    5 days  Active Hospital Problems   Diagnosis Date Noted POA  . *Crohn's disease of ileum with other complication (*) 08/11/2021 Yes  . Hyperbilirubinemia 06/20/2017 Yes  . SBO (small bowel obstruction) (*) 03/26/2017 Unknown  . S/p robotic ileocolectomy, small bowel resection x 2 for Crohn's disease on 03/12/2015, S/p open redo ileocolectomy, SBR 08/11/21 03/12/2015 Not Applicable  . WPW (Wolff-Parkinson-White syndrome) 08/30/2014 Yes    Resolved Hospital Problems  No resolved problems to display.    Current Discharge Medication List    CONTINUED medications   Details  acetaminophen  (TYLENOL ) 325 mg tablet Take one tablet (325 mg dose) by mouth every 6 (six) hours as needed for Pain.    Cholecalciferol (VITAMIN D3) 50 MCG (2000 UT) TABS Take 1 tablet by mouth daily.    diphenhydrAMINE (BANOPHEN) 25 mg tablet Take one tablet (25 mg dose) by mouth every 6 (six) hours as needed for Itching.    ferrous sulfate (FERROUS SULFATE) 325 (65 FE) MG tablet Take one tablet (325 mg dose) by mouth 1 (one) time each day with breakfast.    fish oil (SEA-OMEGA) 1000 mg CAPS Take one capsule (1,000 mg dose) by mouth daily.    loratadine (CLARITIN) 10 MG tablet Take one tablet (10 mg dose) by mouth daily.    Methylcobalamin (B12-ACTIVE PO) Take by mouth.    THERA (THEREMS,ONE-DAILY) TABS Take one tablet by mouth daily.    ustekinumab  (STELARA ) 90 MG/ML SOSY syringe Inject 1 mL (90 mg dose) into the skin Once every eight weeks.       Hospital Course  Physicians involved in care during this hospitalization Attending Provider: Caprice FORBES Essex, MD Admitting Provider: Caprice FORBES Essex, MD Anesthesiologist: Alm Jama Ganser, MD  Indication for Admission: Crohn's disease with partial obstruction Hospital Course:        Patient was admitted on the day of surgery.  He underwent the procedure listed below.  Postoperatively had gradual recovery and return of bowel function.  He is currently having bowel movements and has normal vital signs.  Wound is clean dry and intact abdomen soft nondistended.  I advancing today from full liquids to soft GI diet.  If he does well with this for months she will be planned for discharge this afternoon.  Bedside Procedures   No orders found     Procedure(s) (LRB): (ERAS) REDO ILEOCOLECTOMY OPEN, BILATERAL TAP BLOCKS, SMALL BOWEL RESECTION, LYSIS OF ADHESIONS (N/A)  08/11/2021  Surgeon(s): Caprice FORBES Essex, MD ------------------- [facility]   Activity Instructions    Driving Restrictions     No driving on pain pills.   Shower/Bath     OK to shower, no soaking in tub x 2 weeks     Diet Instructions    No dietary restrictions     Soft GI diet x2 weeks then you may resume a regular diet.     Other Instructions    Discharge instructions     [facility]  Discharge Instructions for Anorectal Surgery Patients   1) The dressing from surgery can be removed later today or tomorrow morning. If there is gauze present within a wound, it can be removed at that time and does not need to be  replaced. It will appear bloody and can be thrown away. Use dry gauze pads to prevent drainage onto your clothes while the wounds heal. Bloody drainage is expected for several days after surgery.   2) If a seton was placed for a fistula, you can expect continued drainage because it is keeping the tract open intentionally. Use gauze or a pad and change as needed to prevent leakage onto your clothes. The color and consistency of the drainage can vary. There are no bathing restrictions with a seton and no need for excessive hygiene after a bowel movement beyond regular cleaning.   3) Take a daily laxative like Miralax to prevent constipation with pain  medications. You must also increase your daily fiber intake, whether through diet (whole grains, leafy greens, etc) or with supplements (Benefiber, Metamucil, etc). Increased fiber intake needs to be part of your daily regimen.   4) Unless specifically instructed otherwise, Tylenol  1000mg  every 8 hours and Ibuprofen 800mg  every 8 hours can be utilized for additional pain control, starting as soon as you get home from surgery. Do not take Ibuprofen if you have a history of gastric/peptic ulcers, kidney disease, or if you have been instructed by a physician to avoid them. A pain medication will be prescribed unless otherwise discussed. If you are prescribed Toradol , that medication will not be refilled after the initial amount is completed.   5) Use sitz baths (sitting in warm water for 5-10 minutes) for pain relief, as well. This can be done as frequently as you want. You can add soap to the water if you wish, but no salts need to be added.   6) If you experience high fever (>101.4 F), inability to urinate, increasing pain, excessive bleeding, or any other concerning symptom, contact our office.   7) Bed rest is not required after anorectal surgery unless specified.  Walking and light aerobic activity as tolerated is encouraged.  8) Follow-up will be scheduled and our office will contact you to help schedule that date.   Stanley B. Melba, MD FACS FASCRS Caprice BRAVO. Lang, MD FACS FASCRS Alm DOROTHA Chimes, MD FACS FASCRS Toribio Callas, MD   Follow-up with me     My office will call to make the appointment.   Reason for referral: Follow-up in Dr. Feliberto office in 1 week for dressing removal.   Referral Type: Consultation   Continuity of Care   Notify Physician for increased abdominal swelling or bloating     Notify Physician for increased pain at the operative site (or unrelieved pain)     Notify Physician for marked redness     Notify Physician for persistent vomiting     Notify  Physician for signs of infection (swelling, heat, redness, red streaks, puss formation, odor from incision or any other trouble at the surgical site)     Notify Physician if unable to urinate     Notify physician - Call your doctor if you experience nausea/vomiting     Notify physician - Contact your doctor for excessive bleeding     Weight restrictions     Lifting restrictions 15 lbs x 6 weeks     Contact information for follow-up    [facility] [place]  Specialty: Colon and Rectal Surgery   [address] [place] [address]  Phone: [phone]     Next Steps: Follow up   Instructions: Follow-up in Dr. Feliberto office in 1 week for dressing removal.  Worth Kitty  Relationship: PCP - [facility] [address] [place] [address]  Phone: [phone]     Next Steps: Follow up       Code Status: Prior   Electronically signed: Caprice FORBES Essex, MD 08/16/2021 / 11:54 AM

## 2022-02-01 ENCOUNTER — Encounter: Payer: Self-pay | Admitting: *Deleted

## 2022-04-22 ENCOUNTER — Encounter: Payer: Self-pay | Admitting: *Deleted

## 2023-03-29 IMAGING — CT CT ABD-PELV W/ CM
2 of 4 series · 16 of 46 positions shown, 18 images · IV contrast (OMNIPAQUE 300)
Comparison: 02/14/2021

CLINICAL DATA: Bowel obstruction suspected.

EXAM:
CT ABDOMEN AND PELVIS WITH CONTRAST
TECHNIQUE: Multidetector CT imaging of the abdomen and pelvis was performed
using the standard protocol following bolus administration of
intravenous contrast.

[Series 2: axial st · axial · 0.76mm/px · z∈[-524,-114]mm · 13 of 92 slices shown, 15 images]
[im 5/92  soft-tissue]
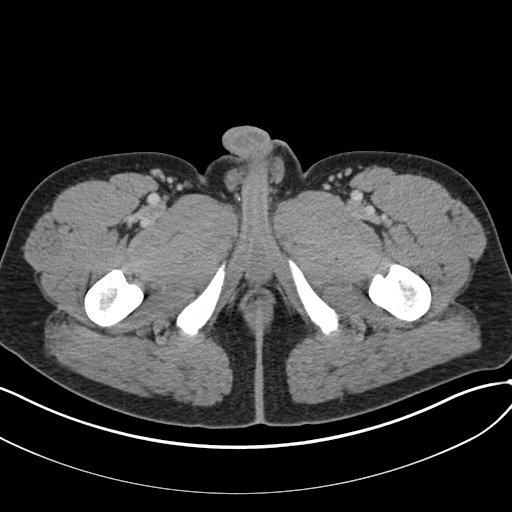
[im 5/92  bone]
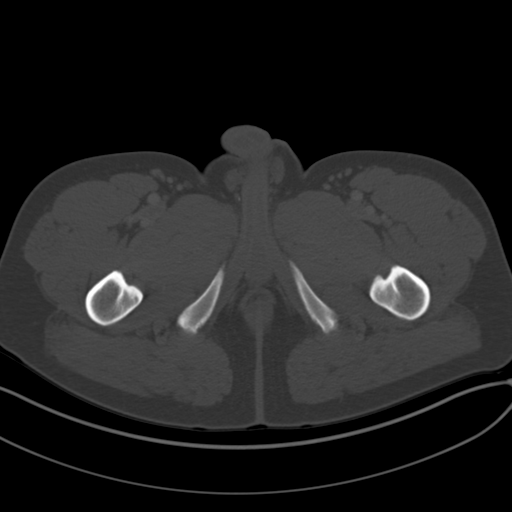
[im 14/92  soft-tissue]
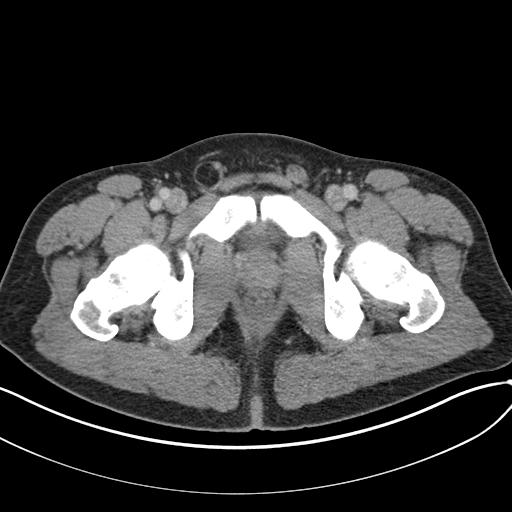
[im 19/92  soft-tissue]
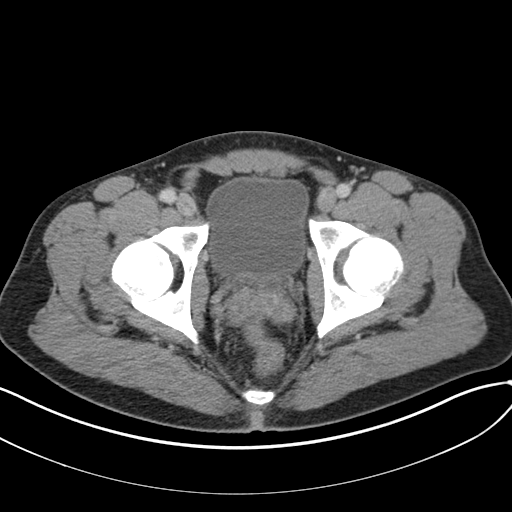
[im 28/92  soft-tissue]
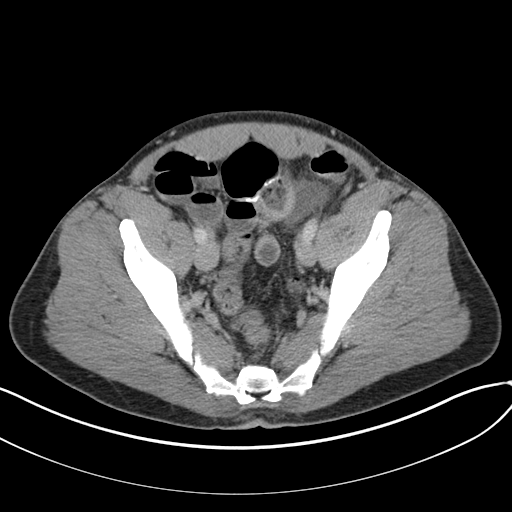
[im 32/92  soft-tissue]
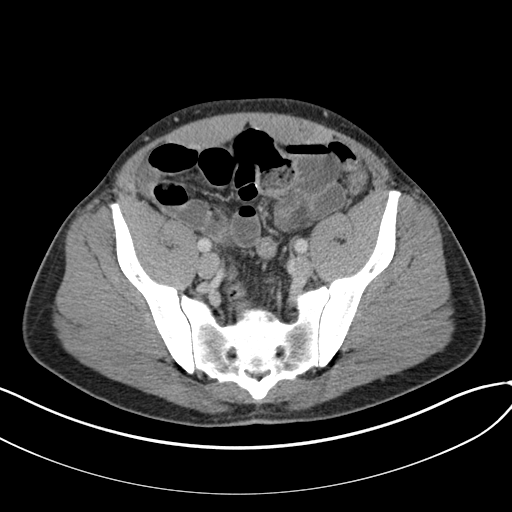
[im 41/92  soft-tissue]
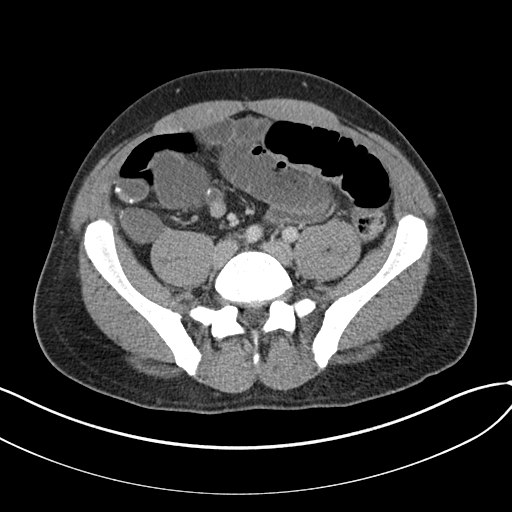
[im 46/92  soft-tissue]
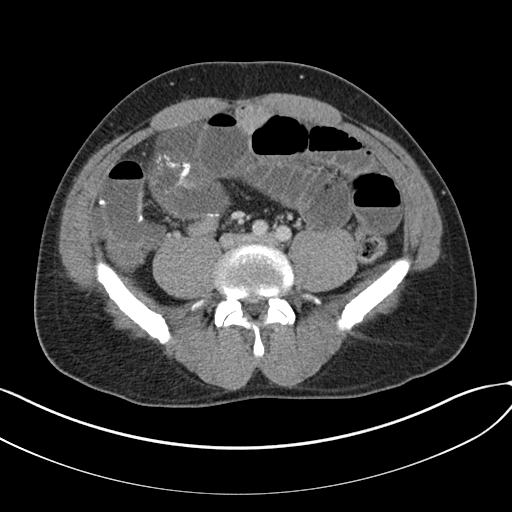
[im 51/92  soft-tissue]
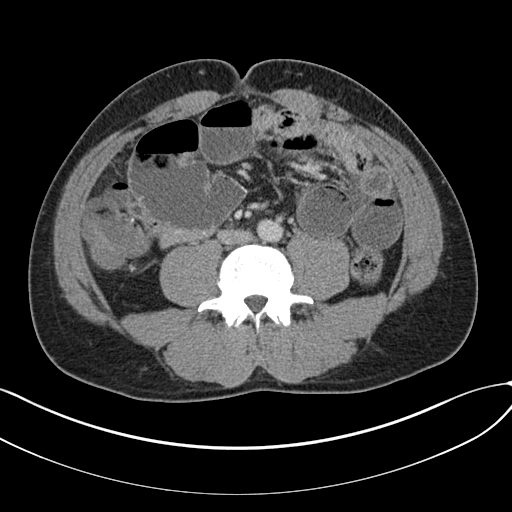
[im 60/92  soft-tissue]
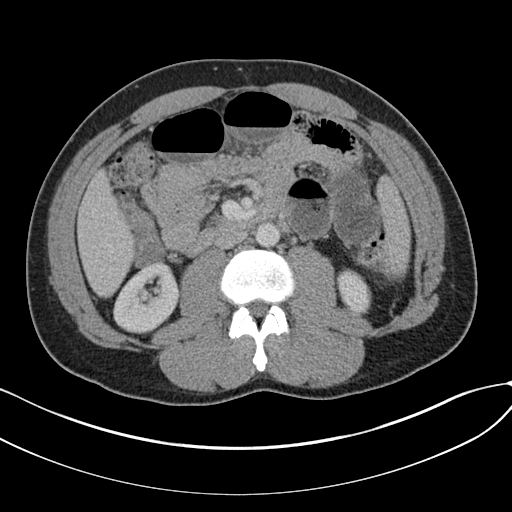
[im 60/92  bone]
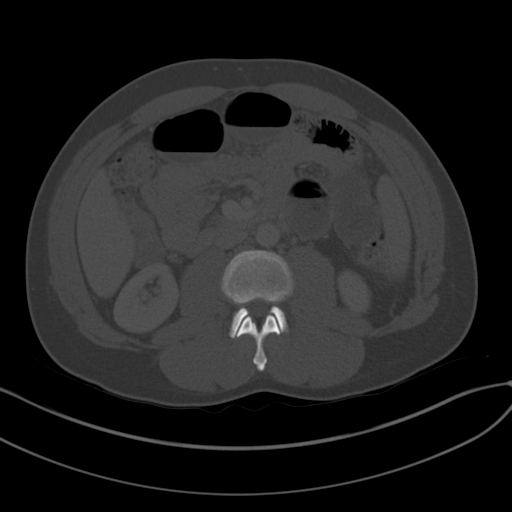
[im 64/92  soft-tissue]
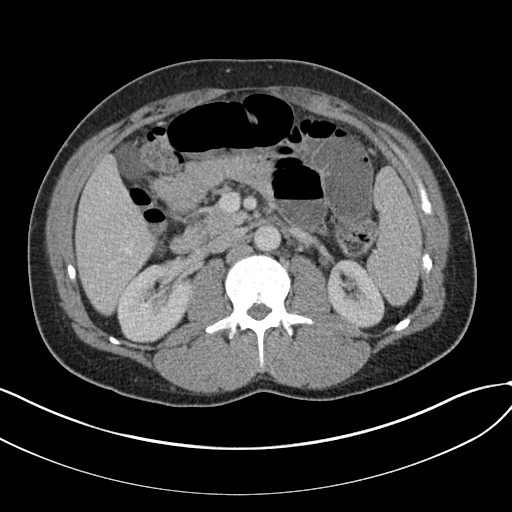
[im 73/92  soft-tissue]
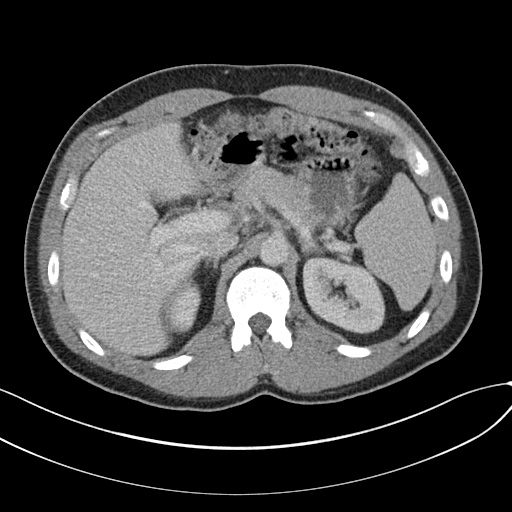
[im 78/92  soft-tissue]
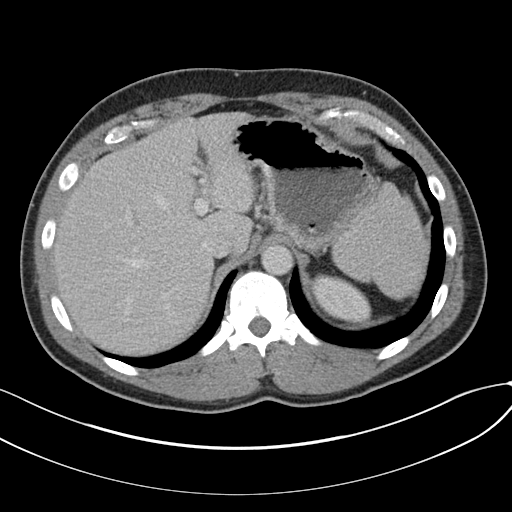
[im 87/92  soft-tissue]
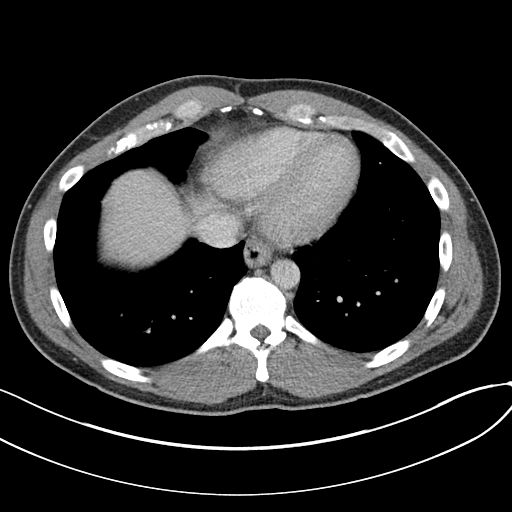

[Series 4: coronal st · coronal · 0.79mm/px · 3 of 151 slices shown]
[im 51/151  soft-tissue]
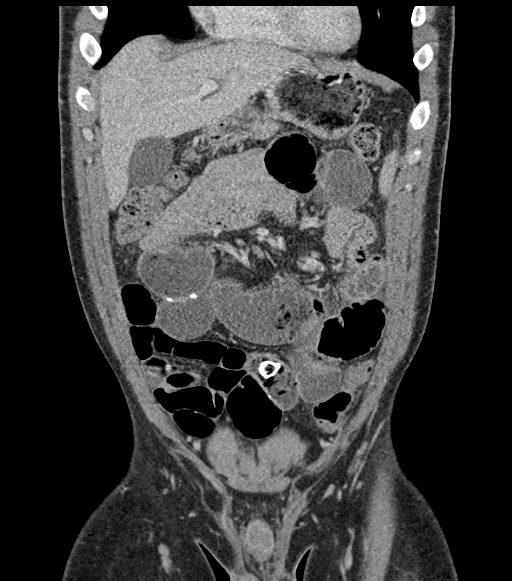
[im 67/151  soft-tissue]
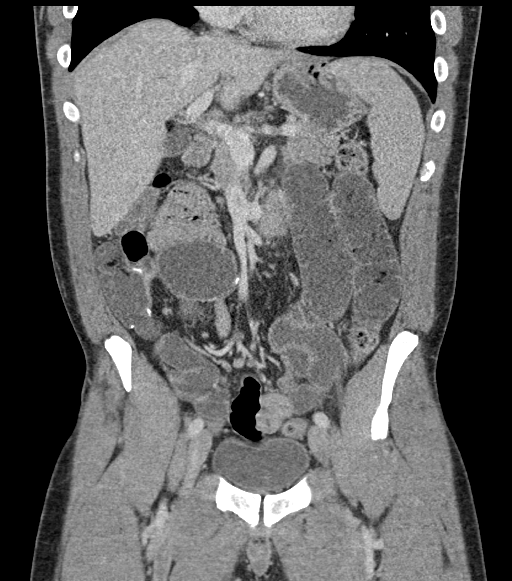
[im 84/151  soft-tissue]
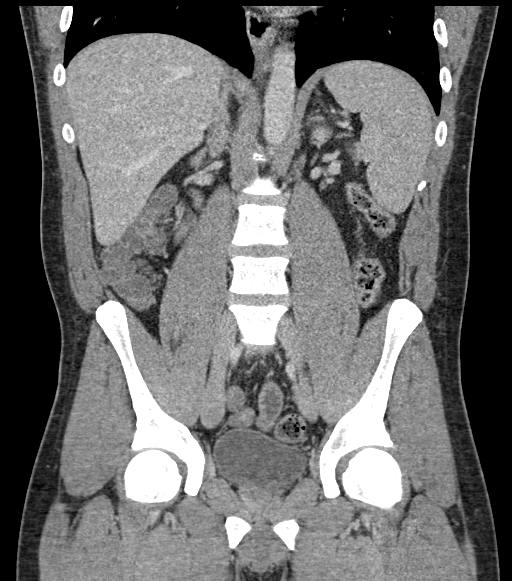

[16 of 46 positions shown; findings below may reference images not displayed]

RADIATION DOSE REDUCTION: This exam was performed according to the
departmental dose-optimization program which includes automated
exposure control, adjustment of the mA and/or kV according to
patient size and/or use of iterative reconstruction technique.

CONTRAST:  100mL OMNIPAQUE IOHEXOL 300 MG/ML  SOLN
FINDINGS: Lower chest: No acute abnormality.

Hepatobiliary: No focal liver abnormality is seen. No radiopaque
gallstones, biliary dilatation, or pericholecystic inflammatory
changes.

Pancreas: Unremarkable. No pancreatic ductal dilatation or
surrounding inflammatory changes.

Spleen: Normal in size without focal abnormality.

Adrenals/Urinary Tract: Adrenal glands are unremarkable. Kidneys are
normal, without renal calculi, focal lesion, or hydronephrosis.
Bladder is unremarkable.

Stomach/Bowel: Small hiatal hernia. Stomach is otherwise normal in
appearance. Proximal small bowel loops are normal caliber. There is
significant dilatation of mid small bowel loops. Numerous surgical
anastomoses are identified. Stable appearance of ileocecal
anastomosis. Transition zone of dilated bowel to normal bowel is
within the RIGHT LOWER QUADRANT, possibly at a surgical site on
image 57 of series 2. There is no free intraperitoneal air. Oval
radiopaque intraluminal foreign body is again noted, today within a
central dilated loops of small bowel. Colon is unremarkable.

Vascular/Lymphatic: Central and RIGHT LOWER QUADRANT mesenteric
lymph nodes are mildly prominent, measuring less than 1 centimeter
in short axis. Vessels are unremarkable.

Reproductive: Prostate is unremarkable.

Other: Postoperative changes in the anterior abdominal wall. Small
amount of ascites.

Musculoskeletal: No acute or significant osseous findings.
IMPRESSION: 1. Small bowel obstruction, localized to the RIGHT LOWER QUADRANT.
2. No free intraperitoneal air.
3. Small amount of ascites.
4. Oval intraluminal radiopaque foreign body again noted within the
central small bowel loops.
5. Numerous bowel anastomoses.

## 2023-04-18 NOTE — Progress Notes (Signed)
 Subjective:    Anthony Reed is a 37 y.o. (DOB [birth year]) male.     Patient presents with  . New Patient     HPI  Pre-visit planning completed by review of patient's chart and SnapShot.  I am meeting Anthony Reed for the first time.  He lives in [place].  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  Married.  He grew up in [place].  Graduated from [employer].  He likes to run.  Trail runs.  Also plays soccer.  Used to smoke.  Quit around 2010.  Chewed tobacco but quit.  He does use nicotine patches still.  He has a history of Crohn's.  He has had 2 resections.  He has had bowel obstructions.  Doing well now.  Sees Anthony Reed.  Colonoscopy scheduled for next month.  He is on Stelara .  He was diagnosed with WPW several years ago.  Asymptomatic.  Saw Anthony Reed.  No intervention.  Lipids have not been checked in several years.  Last lipid panel his lipids looked fine.   Reviewed and updated this visit by provider: Tobacco  Allergies  Meds  Problems  Med Hx  SE Hx  Surg Hx  Fam Hx  Soc Hx      Current Outpatient Medications  Medication Instructions  . acetaminophen  (TYLENOL ) 325 mg, Oral, Every 6 hours as needed  . cetirizine (ZYRTEC) 10 mg, Oral, Daily  . ferrous sulfate 325 mg, Oral, Daily with breakfast  . fish oil (SEA-OMEGA) 1000 mg CAPS 1 capsule, Oral, Daily  . fluticasone propionate (FLONASE) 50 mcg/actuation nasal spray 2 sprays, Nasal, Daily  . Methylcobalamin (B12-ACTIVE PO) Oral  . NA sulfate-potassium sulfate-magnesium  sulfate (SUPREP BOWEL PREP) 17.5-3.13-1.6 GM/177ML 177 mLs, Oral, 2 times a day bowel prep  . sodium chloride  (ALTAMIST,OCEAN,DEEP SEA NASAL SPRAY) 0.65% nasal spray 1 spray, Both Nostrils, As needed  . ustekinumab  (STELARA ) 90 MG/ML SOSY syringe INJECT 1 SYRINGE UNDER THE SKIN EVERY 8 WEEKS     Review of Systems  Constitutional:  Negative for chills, diaphoresis and fever.  Eyes:  Negative for visual  disturbance.  Respiratory:  Negative for cough, chest tightness, shortness of breath and wheezing.   Cardiovascular:  Negative for chest pain, palpitations and leg swelling.  Gastrointestinal:  Negative for abdominal pain and blood in stool.  Genitourinary:  Negative for dysuria and hematuria.  Musculoskeletal:  Negative for myalgias.  Skin:  Negative for rash.  Neurological:  Negative for facial asymmetry, speech difficulty and weakness.  Hematological:  Negative for adenopathy.      Objective:   Vitals:   04/18/23 0920  BP: 132/84  Pulse: 64  Temp: 97.4 F (36.3 C)  TempSrc: Temporal  Height: 5' 11 (1.803 m)  Weight: 212 lb (96.2 kg)  SpO2: 100%  BMI (Calculated): 29.6  PainSc: 0-No pain   Physical Exam Vitals and nursing note reviewed.  Constitutional:      General: He is not in acute distress.    Appearance: He is not ill-appearing or diaphoretic.     Comments: Friendly Well groomed Good historian  HENT:     Head: Normocephalic and atraumatic.  Eyes:     General: No scleral icterus.       Right eye: No discharge.        Left eye: No discharge.     Conjunctiva/sclera: Conjunctivae normal.  Neck:     Vascular: No carotid bruit.  Cardiovascular:  Rate and Rhythm: Normal rate and regular rhythm.     Pulses: Normal pulses.     Heart sounds: Normal heart sounds. No murmur heard.    No friction rub. No gallop.  Musculoskeletal:     Right lower leg: No edema.     Left lower leg: No edema.  Pulmonary:     Effort: Pulmonary effort is normal. No respiratory distress.     Breath sounds: Normal breath sounds. No wheezing, rhonchi or rales.  Abdominal:     Palpations: Abdomen is soft.     Tenderness: There is no abdominal tenderness.  Lymphadenopathy:     Cervical: No cervical adenopathy.  Skin:    General: Skin is warm and dry.     Coloration: Skin is not jaundiced.     Findings: No rash.  Neurological:     General: No focal deficit present.     Mental  Status: He is alert.  Psychiatric:        Mood and Affect: Mood normal.        Behavior: Behavior normal.        Thought Content: Thought content normal.         Assessment / Plan:   Assessment 1. Crohn's disease with complication, unspecified gastrointestinal tract location (*) (Primary) 2. Immunodeficiency due to drugs (*) 3. WPW (Wolff-Parkinson-White syndrome) 4. Lipid screening    Plan  Continue to see Anthony Reed for his Crohn's.  He is doing very well right now.  He is on Stelara  and doing fine.  WPW not an issue.  Discussed if he needed to see Anthony Reed again.  Asymptomatic.  Monitor for now.  He is supposed to get labs for GI soon.  He goes to a [facility] in [place].  I will add a lipid panel for [facility] when the time comes.  He is going to send me a MyChart message.  If he is doing well see me in 1 year.  Documentation for time-based billing:  Total time spent of date of service was 45 minutes.  Patient care activities included preparing to see the patient such as reviewing the patient record, performing a medically appropriate history and physical examination, counseling and educating the patient, family, and/or caregiver, and documenting clinical information in the electronic or other health record.   Patient Instructions  Let me know when you are going to have your labs done.   Medications in chart reviewed, updated, and copy given to patient.  No learning barriers identified.   Risks, benefits, and alternatives of the medications and treatment plan prescribed today were discussed, and patient expressed understanding. Plan follow-up as discussed or as needed if any worsening symptoms or change in condition.

## 2024-02-17 ENCOUNTER — Observation Stay (HOSPITAL_COMMUNITY): Admission: EM | Admit: 2024-02-17 | Discharge: 2024-02-18 | Disposition: A | Discharge status: Home / Self Care

## 2024-02-17 ENCOUNTER — Emergency Department (HOSPITAL_COMMUNITY)

## 2024-02-17 ENCOUNTER — Encounter (HOSPITAL_COMMUNITY): Payer: Self-pay | Admitting: *Deleted

## 2024-02-17 ENCOUNTER — Other Ambulatory Visit: Payer: Self-pay

## 2024-02-17 DIAGNOSIS — F1090 Alcohol use, unspecified, uncomplicated: Secondary | ICD-10-CM | POA: Insufficient documentation

## 2024-02-17 DIAGNOSIS — K5090 Crohn's disease, unspecified, without complications: Secondary | ICD-10-CM | POA: Insufficient documentation

## 2024-02-17 DIAGNOSIS — K56600 Partial intestinal obstruction, unspecified as to cause: Secondary | ICD-10-CM | POA: Diagnosis present

## 2024-02-17 DIAGNOSIS — Z8719 Personal history of other diseases of the digestive system: Secondary | ICD-10-CM

## 2024-02-17 DIAGNOSIS — R109 Unspecified abdominal pain: Secondary | ICD-10-CM | POA: Diagnosis present

## 2024-02-17 DIAGNOSIS — Z87891 Personal history of nicotine dependence: Secondary | ICD-10-CM | POA: Diagnosis not present

## 2024-02-17 DIAGNOSIS — K56609 Unspecified intestinal obstruction, unspecified as to partial versus complete obstruction: Secondary | ICD-10-CM | POA: Diagnosis not present

## 2024-02-17 DIAGNOSIS — N179 Acute kidney failure, unspecified: Secondary | ICD-10-CM | POA: Diagnosis not present

## 2024-02-17 DIAGNOSIS — K50919 Crohn's disease, unspecified, with unspecified complications: Secondary | ICD-10-CM | POA: Diagnosis present

## 2024-02-17 DIAGNOSIS — I456 Pre-excitation syndrome: Secondary | ICD-10-CM | POA: Diagnosis present

## 2024-02-17 LAB — COMPREHENSIVE METABOLIC PANEL WITH GFR
ALT: 29 U/L (ref 0–44)
AST: 40 U/L (ref 15–41)
Albumin: 4.7 g/dL (ref 3.5–5.0)
Alkaline Phosphatase: 68 U/L (ref 38–126)
Anion gap: 17 — ABNORMAL HIGH (ref 5–15)
BUN: 10 mg/dL (ref 6–20)
CO2: 19 mmol/L — ABNORMAL LOW (ref 22–32)
Calcium: 9.7 mg/dL (ref 8.9–10.3)
Chloride: 103 mmol/L (ref 98–111)
Creatinine, Ser: 1.53 mg/dL — ABNORMAL HIGH (ref 0.61–1.24)
GFR, Estimated: 59 mL/min — ABNORMAL LOW (ref >60)
Glucose, Bld: 137 mg/dL — ABNORMAL HIGH (ref 70–99)
Potassium: 3.9 mmol/L (ref 3.5–5.1)
Sodium: 139 mmol/L (ref 135–145)
Total Bilirubin: 2.1 mg/dL — ABNORMAL HIGH (ref 0.0–1.2)
Total Protein: 7.8 g/dL (ref 6.5–8.1)

## 2024-02-17 LAB — CBC
HCT: 45.1 % (ref 39.0–52.0)
Hemoglobin: 14.8 g/dL (ref 13.0–17.0)
MCH: 25.5 pg — ABNORMAL LOW (ref 26.0–34.0)
MCHC: 32.8 g/dL (ref 30.0–36.0)
MCV: 77.6 fL — ABNORMAL LOW (ref 80.0–100.0)
Platelets: 389 K/uL (ref 150–400)
RBC: 5.81 MIL/uL (ref 4.22–5.81)
RDW: 14.6 % (ref 11.5–15.5)
WBC: 13.4 K/uL — ABNORMAL HIGH (ref 4.0–10.5)
nRBC: 0 % (ref 0.0–0.2)

## 2024-02-17 LAB — I-STAT CG4 LACTIC ACID, ED
Lactic Acid, Venous: 4.8 mmol/L (ref 0.5–1.9)
Lactic Acid, Venous: 0.9 mmol/L (ref 0.5–1.9)

## 2024-02-17 LAB — LIPASE, BLOOD: Lipase: 31 U/L (ref 11–51)

## 2024-02-17 MED ORDER — HYDROMORPHONE HCL 1 MG/ML IJ SOLN
1.0000 mg | Freq: Once | INTRAMUSCULAR | Status: AC
  Administered 2024-02-17: 1 mg via INTRAVENOUS
  Filled 2024-02-17: qty 1

## 2024-02-17 MED ORDER — IOHEXOL 350 MG/ML SOLN
75.0000 mL | Freq: Once | INTRAVENOUS | Status: AC | PRN
  Administered 2024-02-17: 75 mL via INTRAVENOUS

## 2024-02-17 MED ORDER — SODIUM CHLORIDE 0.9 % IV BOLUS
1000.0000 mL | Freq: Once | INTRAVENOUS | Status: AC
  Administered 2024-02-17: 1000 mL via INTRAVENOUS

## 2024-02-17 MED ORDER — ONDANSETRON HCL 4 MG/2ML IJ SOLN
4.0000 mg | Freq: Once | INTRAMUSCULAR | Status: AC
  Administered 2024-02-17: 4 mg via INTRAVENOUS
  Filled 2024-02-17: qty 2

## 2024-02-17 MED ORDER — HYDROMORPHONE HCL 1 MG/ML IJ SOLN
1.0000 mg | INTRAMUSCULAR | Status: DC | PRN
  Administered 2024-02-17: 1 mg via INTRAVENOUS
  Filled 2024-02-17: qty 1

## 2024-02-17 NOTE — ED Triage Notes (Signed)
 Pt reports severe abd pain x4 hours. Pt bent over in pain at desk. H/x Crohn's. Frequesnt SBO's.

## 2024-02-17 NOTE — H&P (Signed)
 History and Physical    Anthony Reed FMW:[id number] DOB: 16-Nov-1985 DOA: 02/17/2024  PCP: Kennyth Worth HERO, MD   Patient coming from: Home  I have personally briefly reviewed patient's old medical records in [facility]  Chief Complaint: Abdominal pain  HPI: Anthony Reed is a 38 y.o. male with medical history significant for Crohn's disease, Wolff-Parkinson-White. Patient presented to the ED with complaints of severe abdominal pain that started at about 1 PM today.  Last bowel movement was earlier today and was watery.  Reports vomiting also today.  Symptoms feel similar to his prior small bowel obstructions for which he has required surgery twice in the past but on several occasions, symptoms resolved by the next day.  ED Course: Stable vitals. WBC 13.4. Creatinine elevated at 1.5. I-STAT lactic acid 4.8 >> 0.9 CTAP w contrast- Changes consistent with partial small bowel obstruction in the right mid abdomen at the level of prior small bowel small bowel anastomosis. Narrowing is best seen on the coronal imaging as described. General surgery consulted, NG tube recommended.  But patient declined. IV Dilaudid  X 2, 1 L bolus given.  Review of Systems: As per HPI all other systems reviewed and negative.  Past Medical History:  Diagnosis Date   Allergy    Crohn disease (HCC)    Crohn disease (HCC)    GERD (gastroesophageal reflux disease)    Wolff-Parkinson-White (WPW) pattern     Past Surgical History:  Procedure Laterality Date   ANKLE SURGERY Right    BOWEL RESECTION  2016   Small bowel   COLONOSCOPY     with purposed inflation to improve area that gets SBO kink.     reports that he quit smoking about 15 years ago. His smoking use included cigarettes. He has never used smokeless tobacco. He reports current alcohol use. He reports that he does not use drugs.  No Known Allergies  Family History  Problem Relation Age of Onset   Thyroid disease Mother    Asthma  Father    Hyperlipidemia Father    Hypertension Father    Early death Maternal Grandfather    Cancer Maternal Grandfather 30   COPD Paternal Grandmother    Heart disease Paternal Grandfather    Hyperlipidemia Paternal Grandfather    Hypertension Paternal Grandfather    Kidney disease Paternal Grandfather    Asthma Sister    Colon cancer Neg Hx    Prostate cancer Neg Hx    Acceptable: Family history reviewed and not pertinent (If you reviewed it)  Prior to Admission medications   Medication Sig Start Date End Date Taking? Authorizing Provider  acetaminophen  (TYLENOL ) 500 MG tablet Take 500 mg by mouth every 6 (six) hours as needed for moderate pain, mild pain, fever or headache.   Yes [provider]  FIBER PO Take by mouth.   Yes [provider]  loratadine (CLARITIN) 10 MG tablet Take 10 mg by mouth daily as needed for itching, rhinitis or allergies.   Yes [provider]  Omega-3 Fatty Acids (FISH OIL PO) Take 1 capsule by mouth daily.   Yes [provider]  Probiotic Product (PROBIOTIC PO) Take 1 tablet by mouth daily.   Yes [provider]  ustekinumab  (STELARA ) 90 MG/ML SOSY injection Inject 90 mg into the skin See admin instructions. INJECT 1 SYRINGE UNDER THE SKIN ONCE EVERY 8 WEEKS. REFRIGERATE. DO NOT FREEZE. 03/19/20  Yes [provider]  predniSONE  (DELTASONE ) 10 MG tablet Take 50mg  daily  for 7 days,Take 40mg  daily for 7 days,Take 30mg  daily for 7 days,Take 20mg  daily for 7 days,Take 10mg  daily for 7 days, then stop Patient not taking: Reported on 02/17/2024 07/06/21   [name], MD    Physical Exam: Vitals:   02/17/24 1930 02/17/24 1945 02/17/24 2000 02/17/24 2015  BP: 113/77 (!) 126/101 114/78 120/84  Pulse: (!) 55 62 62 (!) 59  Resp: 10 11 15 15   Temp:      SpO2: 100% 100% 100% 100%  Weight:      Height:        Constitutional: NAD, calm, comfortable Vitals:   02/17/24 1930 02/17/24 1945 02/17/24 2000  02/17/24 2015  BP: 113/77 (!) 126/101 114/78 120/84  Pulse: (!) 55 62 62 (!) 59  Resp: 10 11 15 15   Temp:      SpO2: 100% 100% 100% 100%  Weight:      Height:       Eyes: PERRL, lids and conjunctivae normal ENMT: Mucous membranes are dry.  Neck: normal, supple, no masses, no thyromegaly Respiratory: clear to auscultation bilaterally, no wheezing, no crackles. Normal respiratory effort. No accessory muscle use.  Cardiovascular: Regular rate and rhythm, no murmurs / rubs / gallops. No extremity edema. Abdomen: Soft, not distended, no tenderness, no masses palpated. No hepatosplenomegaly.   Musculoskeletal: no clubbing / cyanosis. No joint deformity upper and lower extremities.  Skin: no rashes, lesions, ulcers. No induration Neurologic: No facial asymmetry, moving extremity spontaneously, speech fluent.  Psychiatric: Normal judgment and insight. Alert and oriented x 3. Normal mood.   Labs on Admission: I have personally reviewed following labs and imaging studies  CBC: Recent Labs  Lab 02/17/24 1833  WBC 13.4*  HGB 14.8  HCT 45.1  MCV 77.6*  PLT 389   Basic Metabolic Panel: Recent Labs  Lab 02/17/24 1833  NA 139  K 3.9  CL 103  CO2 19*  GLUCOSE 137*  BUN 10  CREATININE 1.53*  CALCIUM 9.7   GFR: Estimated Creatinine Clearance: 74.7 mL/min (A) (by C-G formula based on SCr of 1.53 mg/dL (H)). Liver Function Tests: Recent Labs  Lab 02/17/24 1833  AST 40  ALT 29  ALKPHOS 68  BILITOT 2.1*  PROT 7.8  ALBUMIN 4.7   Recent Labs  Lab 02/17/24 1833  LIPASE 31   Radiological Exams on Admission: CT ABDOMEN PELVIS W CONTRAST Result Date: 02/17/2024 CLINICAL DATA:  History of Crohn's with history of prior small bowel obstructions and surgery with new onset abdominal pain EXAM: CT ABDOMEN AND PELVIS WITH CONTRAST TECHNIQUE: Multidetector CT imaging of the abdomen and pelvis was performed using the standard protocol following bolus administration of intravenous  contrast. RADIATION DOSE REDUCTION: This exam was performed according to the departmental dose-optimization program which includes automated exposure control, adjustment of the mA and/or kV according to patient size and/or use of iterative reconstruction technique. CONTRAST:  75mL OMNIPAQUE  IOHEXOL  350 MG/ML SOLN COMPARISON:  Plain film from earlier in the same day. FINDINGS: Lower chest: No acute abnormality. Hepatobiliary: No focal liver abnormality is seen. No gallstones, gallbladder wall thickening, or biliary dilatation. Pancreas: Unremarkable. No pancreatic ductal dilatation or surrounding inflammatory changes. Spleen: Normal in size without focal abnormality. Adrenals/Urinary Tract: Adrenal glands are within normal limits. Kidneys demonstrate a normal enhancement pattern bilaterally. No renal calculi or obstructive changes are seen. The bladder is partially distended. Stomach/Bowel: Prior changes are noted in the right lower quadrant consistent with distal small bowel resection and reanastomosis to the  colon. The appendix is not visualized likely surgically removed during the prior procedure. Small bowel surgical changes are noted as well in the right mid abdomen. The stomach is within normal limits. Proximal small bowel appears within normal limits. There are dilated loops of central small bowel identified which continue to the level of the small bowel to small bowel anastomosis in the right mid abdomen. An area of narrowing is noted at the anastomotic site best seen on image number 66 of series 6 on the coronal images. These changes are consistent with a partial small bowel obstruction. The more distal small bowel is within normal limits. Vascular/Lymphatic: No significant vascular findings are present. No enlarged abdominal or pelvic lymph nodes. Reproductive: Prostate is unremarkable. Other: Mild free fluid is noted within the pelvis. No herniation is noted. Musculoskeletal: No acute or significant  osseous findings. IMPRESSION: Changes consistent with partial small bowel obstruction in the right mid abdomen at the level of prior small bowel small bowel anastomosis. Narrowing is best seen on the coronal imaging as described. No other focal abnormality is noted. Electronically Signed   By: Oneil Devonshire M.D.   On: 02/17/2024 20:04   DG Abd Portable 1 View Result Date: 02/17/2024 CLINICAL DATA:  Query obstruction. Severe abdominal pain for 4 hours. History of Crohn disease with frequent small-bowel obstructions. EXAM: PORTABLE ABDOMEN - 1 VIEW COMPARISON:  CT abdomen and pelvis 07/05/2021 FINDINGS: Gas-filled distended small bowel in the left upper quadrant likely indicating obstruction. Colon is decompressed. No radiopaque stones. Visualized bones and soft tissue contours appear intact. Lung bases are clear. IMPRESSION: Gaseous distention of left upper quadrant small bowel suggesting obstruction. Electronically Signed   By: Elsie Gravely M.D.   On: 02/17/2024 19:20    EKG: None.   Assessment/Plan Principal Problem:   Partial small bowel obstruction (HCC) Active Problems:   Crohn's disease with complication (HCC)   WPW (Wolff-Parkinson-White syndrome)   History of Crohn's disease   Assessment and Plan:  Partial small bowel obstruction- several prior episodes of SBO.  Has required surgery in the past, has also self resolved on several occasions.. CT shows - changes consistent with partial small bowel obstruction in the right midabdomen. Lactic acidosis of 4.8 >> 0.9. - N.p.o. - Patient declined NG tube - Evaluated by general surgery- Dr. Lyndel- conservative management, no need for emergent surgery, may want to consider endoscopic options if possible - 1 L bolus given, continue D5 LR 75cc/hr X 1 day - IV Dilaudid  1 mg every 4 hourly as needed - WBC 13.4, trend  AKI, creatinine 1.53, baseline 1-1.1. - Hydrate  Crohn's disease - On Stelara   injections.  Wolff-Parkinson-White -Monitor on telemetry   DVT prophylaxis: Heparin Code Status: FULL Family Communication: Father at bedside Disposition Plan: ~ 2 days Consults called: Gen Surg Admission status: Obs Tele    Author: Tully FORBES Carwin, MD 02/17/2024 12:54 AM  For on call review [email or web address].

## 2024-02-17 NOTE — ED Provider Notes (Signed)
  EMERGENCY DEPARTMENT AT [facility] Provider Note   CSN: [id number] Arrival date & time: 02/17/24  1821     Patient presents with: Abdominal Pain   Anthony Reed is a 38 y.o. male.  With a history of small bowel obstruction status post2x resection and Crohn's disease who presents to the ED for severe abdominal pain.  Severe abdominal pain began around 1 PM today.  Patient felt well earlier this morning was able to go for a run.  Severe abdominal pain with nausea and vomiting feels similar to prior episodes of bowel obstruction.  Last bowel movement was earlier this morning was watery.  Is on Stelara  injections for Crohn's every 8 weeks.    Abdominal Pain      Prior to Admission medications   Medication Sig Start Date End Date Taking? Authorizing Provider  acetaminophen  (TYLENOL ) 500 MG tablet Take 500 mg by mouth every 6 (six) hours as needed for moderate pain, mild pain, fever or headache.   Yes [provider]  FIBER PO Take by mouth.   Yes [provider]  loratadine (CLARITIN) 10 MG tablet Take 10 mg by mouth daily as needed for itching, rhinitis or allergies.   Yes [provider]  Omega-3 Fatty Acids (FISH OIL PO) Take 1 capsule by mouth daily.   Yes [provider]  Probiotic Product (PROBIOTIC PO) Take 1 tablet by mouth daily.   Yes [provider]  ustekinumab  (STELARA ) 90 MG/ML SOSY injection Inject 90 mg into the skin See admin instructions. INJECT 1 SYRINGE UNDER THE SKIN ONCE EVERY 8 WEEKS. REFRIGERATE. DO NOT FREEZE. 03/19/20  Yes [provider]  predniSONE  (DELTASONE ) 10 MG tablet Take 50mg  daily for 7 days,Take 40mg  daily for 7 days,Take 30mg  daily for 7 days,Take 20mg  daily for 7 days,Take 10mg  daily for 7 days, then stop Patient not taking: Reported on 02/17/2024 07/06/21   [name], MD    Allergies: Patient has no known allergies.    Review of Systems  Gastrointestinal:  Positive  for abdominal pain.    Updated Vital Signs BP 120/84   Pulse (!) 59   Temp 97.9 F (36.6 C)   Resp 15   Ht 5' 11 (1.803 m)   Wt 90.7 kg   SpO2 100%   BMI 27.89 kg/m   Physical Exam Vitals and nursing note reviewed.  Constitutional:      General: He is in acute distress.  HENT:     Head: Normocephalic and atraumatic.  Eyes:     Pupils: Pupils are equal, round, and reactive to light.  Cardiovascular:     Rate and Rhythm: Normal rate and regular rhythm.  Pulmonary:     Effort: Pulmonary effort is normal.     Breath sounds: Normal breath sounds.  Abdominal:     Palpations: Abdomen is soft.     Tenderness: There is generalized abdominal tenderness. There is no guarding or rebound.     Comments: Generalized diffuse mild tenderness without rigidity or guarding  Skin:    General: Skin is warm and dry.  Neurological:     Mental Status: He is alert.  Psychiatric:        Mood and Affect: Mood normal.     (all labs ordered are listed, but only abnormal results are displayed) Labs Reviewed  COMPREHENSIVE METABOLIC PANEL WITH GFR - Abnormal; Notable for the following components:      Result Value   CO2 19 (*)  Glucose, Bld 137 (*)    Creatinine, Ser 1.53 (*)    Total Bilirubin 2.1 (*)    GFR, Estimated 59 (*)    Anion gap 17 (*)    All other components within normal limits  CBC - Abnormal; Notable for the following components:   WBC 13.4 (*)    MCV 77.6 (*)    MCH 25.5 (*)    All other components within normal limits  I-STAT CG4 LACTIC ACID, ED - Abnormal; Notable for the following components:   Lactic Acid, Venous 4.8 (*)    All other components within normal limits  LIPASE, BLOOD  URINALYSIS, ROUTINE W REFLEX MICROSCOPIC  I-STAT CG4 LACTIC ACID, ED    EKG: None  Radiology: CT ABDOMEN PELVIS W CONTRAST Result Date: 02/17/2024 CLINICAL DATA:  History of Crohn's with history of prior small bowel obstructions and surgery with new onset abdominal pain EXAM: CT  ABDOMEN AND PELVIS WITH CONTRAST TECHNIQUE: Multidetector CT imaging of the abdomen and pelvis was performed using the standard protocol following bolus administration of intravenous contrast. RADIATION DOSE REDUCTION: This exam was performed according to the departmental dose-optimization program which includes automated exposure control, adjustment of the mA and/or kV according to patient size and/or use of iterative reconstruction technique. CONTRAST:  75mL OMNIPAQUE  IOHEXOL  350 MG/ML SOLN COMPARISON:  Plain film from earlier in the same day. FINDINGS: Lower chest: No acute abnormality. Hepatobiliary: No focal liver abnormality is seen. No gallstones, gallbladder wall thickening, or biliary dilatation. Pancreas: Unremarkable. No pancreatic ductal dilatation or surrounding inflammatory changes. Spleen: Normal in size without focal abnormality. Adrenals/Urinary Tract: Adrenal glands are within normal limits. Kidneys demonstrate a normal enhancement pattern bilaterally. No renal calculi or obstructive changes are seen. The bladder is partially distended. Stomach/Bowel: Prior changes are noted in the right lower quadrant consistent with distal small bowel resection and reanastomosis to the colon. The appendix is not visualized likely surgically removed during the prior procedure. Small bowel surgical changes are noted as well in the right mid abdomen. The stomach is within normal limits. Proximal small bowel appears within normal limits. There are dilated loops of central small bowel identified which continue to the level of the small bowel to small bowel anastomosis in the right mid abdomen. An area of narrowing is noted at the anastomotic site best seen on image number 66 of series 6 on the coronal images. These changes are consistent with a partial small bowel obstruction. The more distal small bowel is within normal limits. Vascular/Lymphatic: No significant vascular findings are present. No enlarged abdominal or  pelvic lymph nodes. Reproductive: Prostate is unremarkable. Other: Mild free fluid is noted within the pelvis. No herniation is noted. Musculoskeletal: No acute or significant osseous findings. IMPRESSION: Changes consistent with partial small bowel obstruction in the right mid abdomen at the level of prior small bowel small bowel anastomosis. Narrowing is best seen on the coronal imaging as described. No other focal abnormality is noted. Electronically Signed   By: Oneil Devonshire M.D.   On: 02/17/2024 20:04   DG Abd Portable 1 View Result Date: 02/17/2024 CLINICAL DATA:  Query obstruction. Severe abdominal pain for 4 hours. History of Crohn disease with frequent small-bowel obstructions. EXAM: PORTABLE ABDOMEN - 1 VIEW COMPARISON:  CT abdomen and pelvis 07/05/2021 FINDINGS: Gas-filled distended small bowel in the left upper quadrant likely indicating obstruction. Colon is decompressed. No radiopaque stones. Visualized bones and soft tissue contours appear intact. Lung bases are clear. IMPRESSION: Gaseous distention of left  upper quadrant small bowel suggesting obstruction. Electronically Signed   By: Elsie Gravely M.D.   On: 02/17/2024 19:20     Procedures   Medications Ordered in the ED  sodium chloride  0.9 % bolus 1,000 mL (1,000 mLs Intravenous New Bag/Given 02/17/24 1908)  ondansetron  (ZOFRAN ) injection 4 mg (4 mg Intravenous Given 02/17/24 1905)  HYDROmorphone  (DILAUDID ) injection 1 mg (1 mg Intravenous Given 02/17/24 1905)  iohexol  (OMNIPAQUE ) 350 MG/ML injection 75 mL (75 mLs Intravenous Contrast Given 02/17/24 1942)  HYDROmorphone  (DILAUDID ) injection 1 mg (1 mg Intravenous Given 02/17/24 2024)    Clinical Course as of 02/17/24 2125  Fri Feb 17, 2024  2124 Discussed with Dr.Stechschulte (general surgery).  Partial bowel obstruction.  No need for surgical intervention at this time.  Recommends NG tube bowel rest pain management admission to medicine.  Patient refused NG tube.  Discussed  with admitting hospitalist who accept patient for admission [MP]    Clinical Course User Index [MP] Pamella Ozell LABOR, DO                                 Medical Decision Making 38 year old male with history as above presented to the ED given concern for recurrent SBO.  Acute onset abdominal pain began this afternoon persisted since the onset.  Last bowel movement was today and was loose in character.  Nausea vomiting uncomfortable appearing on my exam.  Abdomen is soft with mild diffuse tenderness but no rebound rigidity or guarding.  We will obtain abdominal x-ray to look for any evidence of free air which would warrant emergent surgical intervention.  Will obtain laboratory workup including venous lactic and CT abdomen pelvis to better characterize likely bowel obstruction.  Will discuss with general surgery once workup is back.  Admission likely.  Amount and/or Complexity of Data Reviewed Radiology: ordered.  Risk Prescription drug management. Decision regarding hospitalization.        Final diagnoses:  Small bowel obstruction (HCC)  Crohn's disease without complication, unspecified gastrointestinal tract location [facility])    ED Discharge Orders     None          Pamella Ozell LABOR, DO 02/17/24 2125

## 2024-02-17 NOTE — Consult Note (Signed)
 Consulting Physician: Deward PARAS Brookie Wayment  Referring Provider: Dr. Pamella  Chief Complaint: SBO  Reason for Consult: SBO   Subjective   HPI: Anthony Reed is an 38 y.o. male who is here for recurrent SBO.  Nausea, vomiting, abdominal pain, just like his previous ~10 small bowel obstructions.  Happy that it has been a longer interval since his last SBO, but frustrated to be back in this position.  Past Medical History:  Diagnosis Date   Allergy    Crohn disease (HCC)    Crohn disease (HCC)    GERD (gastroesophageal reflux disease)    Wolff-Parkinson-White (WPW) pattern     Past Surgical History:  Procedure Laterality Date   ANKLE SURGERY Right    BOWEL RESECTION  2016   Small bowel   COLONOSCOPY     with purposed inflation to improve area that gets SBO kink.    Family History  Problem Relation Age of Onset   Thyroid disease Mother    Asthma Father    Hyperlipidemia Father    Hypertension Father    Early death Maternal Grandfather    Cancer Maternal Grandfather 56   COPD Paternal Grandmother    Heart disease Paternal Grandfather    Hyperlipidemia Paternal Grandfather    Hypertension Paternal Grandfather    Kidney disease Paternal Grandfather    Asthma Sister    Colon cancer Neg Hx    Prostate cancer Neg Hx     Social:  reports that he quit smoking about 15 years ago. His smoking use included cigarettes. He has never used smokeless tobacco. He reports current alcohol use. He reports that he does not use drugs.  Allergies: No Known Allergies  Medications: Current Outpatient Medications  Medication Instructions   acetaminophen  (TYLENOL ) 500 mg, Every 6 hours PRN   FIBER PO Oral   loratadine (CLARITIN) 10 mg, Daily PRN   Omega-3 Fatty Acids (FISH OIL PO) 1 capsule, Daily   predniSONE  (DELTASONE ) 10 MG tablet Take 50mg  daily for 7 days,Take 40mg  daily for 7 days,Take 30mg  daily for 7 days,Take 20mg  daily for 7 days,Take 10mg  daily for 7 days, then stop    Probiotic Product (PROBIOTIC PO) 1 tablet, Daily   Stelara  90 mg, See admin instructions    ROS - all of the below systems have been reviewed with the patient and positives are indicated with bold text General: chills, fever or night sweats Eyes: blurry vision or double vision ENT: epistaxis or sore throat Allergy/Immunology: itchy/watery eyes or nasal congestion Hematologic/Lymphatic: bleeding problems, blood clots or swollen lymph nodes Endocrine: temperature intolerance or unexpected weight changes Breast: new or changing breast lumps or nipple discharge Resp: cough, shortness of breath, or wheezing CV: chest pain or dyspnea on exertion GI: as per HPI GU: dysuria, trouble voiding, or hematuria MSK: joint pain or joint stiffness Neuro: TIA or stroke symptoms Derm: pruritus and skin lesion changes Psych: anxiety and depression  Objective   PE Blood pressure 120/81, pulse 63, temperature 97.9 F (36.6 C), resp. rate 15, height 5' 11 (1.803 m), weight 90.7 kg, SpO2 100%. Constitutional: NAD; conversant; no deformities Eyes: Moist conjunctiva; no lid lag; anicteric; PERRL Neck: Trachea midline; no thyromegaly Lungs: Normal respiratory effort; no tactile fremitus CV: RRR; no palpable thrills; no pitting edema GI: Abd Soft, nontender; no palpable hepatosplenomegaly MSK: Normal range of motion of extremities; no clubbing/cyanosis Psychiatric: Appropriate affect; alert and oriented x3 Lymphatic: No palpable cervical or axillary lymphadenopathy  Results for orders placed or performed  during the hospital encounter of 02/17/24 (from the past 24 hours)  Lipase, blood     Status: None   Collection Time: 02/17/24  6:33 PM  Result Value Ref Range   Lipase 31 11 - 51 U/L  Comprehensive metabolic panel     Status: Abnormal   Collection Time: 02/17/24  6:33 PM  Result Value Ref Range   Sodium 139 135 - 145 mmol/L   Potassium 3.9 3.5 - 5.1 mmol/L   Chloride 103 98 - 111 mmol/L   CO2 19  (L) 22 - 32 mmol/L   Glucose, Bld 137 (H) 70 - 99 mg/dL   BUN 10 6 - 20 mg/dL   Creatinine, Ser 8.46 (H) 0.61 - 1.24 mg/dL   Calcium 9.7 8.9 - 89.6 mg/dL   Total Protein 7.8 6.5 - 8.1 g/dL   Albumin 4.7 3.5 - 5.0 g/dL   AST 40 15 - 41 U/L   ALT 29 0 - 44 U/L   Alkaline Phosphatase 68 38 - 126 U/L   Total Bilirubin 2.1 (H) 0.0 - 1.2 mg/dL   GFR, Estimated 59 (L) >60 mL/min   Anion gap 17 (H) 5 - 15  CBC     Status: Abnormal   Collection Time: 02/17/24  6:33 PM  Result Value Ref Range   WBC 13.4 (H) 4.0 - 10.5 K/uL   RBC 5.81 4.22 - 5.81 MIL/uL   Hemoglobin 14.8 13.0 - 17.0 g/dL   HCT 54.8 60.9 - 47.9 %   MCV 77.6 (L) 80.0 - 100.0 fL   MCH 25.5 (L) 26.0 - 34.0 pg   MCHC 32.8 30.0 - 36.0 g/dL   RDW 85.3 88.4 - 84.4 %   Platelets 389 150 - 400 K/uL   nRBC 0.0 0.0 - 0.2 %  I-Stat Lactic Acid     Status: Abnormal   Collection Time: 02/17/24  7:25 PM  Result Value Ref Range   Lactic Acid, Venous 4.8 (HH) 0.5 - 1.9 mmol/L   Comment NOTIFIED PHYSICIAN   I-Stat Lactic Acid     Status: None   Collection Time: 02/17/24  9:16 PM  Result Value Ref Range   Lactic Acid, Venous 0.9 0.5 - 1.9 mmol/L     Imaging Orders         DG Abd Portable 1 View         CT ABDOMEN PELVIS W CONTRAST      Assessment and Plan   Anthony Reed is an 38 y.o. male with a SBO.  We discussed NG but he has had bad experience with NG previously and declined.  Will monitor with IVF and bowel rest.  No need for emergent surgery.  Looks to be related to a previous RLQ anastomosis again.  May want to consider endoscopic options if possible.    ICD-10-CM   1. Small bowel obstruction (HCC)  K56.609     2. Crohn's disease without complication, unspecified gastrointestinal tract location [facility])  K50.90        Deward JINNY Foy, MD  [facility] Use [email or web address] to contact on call provider  New Patient Billing: [phone] - High MDM

## 2024-02-17 NOTE — ED Notes (Signed)
 Xray at beside

## 2024-02-18 DIAGNOSIS — K56600 Partial intestinal obstruction, unspecified as to cause: Secondary | ICD-10-CM | POA: Diagnosis not present

## 2024-02-18 LAB — CBC
HCT: 39.9 % (ref 39.0–52.0)
Hemoglobin: 12.9 g/dL — ABNORMAL LOW (ref 13.0–17.0)
MCH: 26.1 pg (ref 26.0–34.0)
MCHC: 32.3 g/dL (ref 30.0–36.0)
MCV: 80.6 fL (ref 80.0–100.0)
Platelets: 260 K/uL (ref 150–400)
RBC: 4.95 MIL/uL (ref 4.22–5.81)
RDW: 15.1 % (ref 11.5–15.5)
WBC: 10.4 K/uL (ref 4.0–10.5)
nRBC: 0 % (ref 0.0–0.2)

## 2024-02-18 LAB — BASIC METABOLIC PANEL WITH GFR
Anion gap: 10 (ref 5–15)
BUN: 10 mg/dL (ref 6–20)
CO2: 24 mmol/L (ref 22–32)
Calcium: 8.7 mg/dL — ABNORMAL LOW (ref 8.9–10.3)
Chloride: 106 mmol/L (ref 98–111)
Creatinine, Ser: 1.29 mg/dL — ABNORMAL HIGH (ref 0.61–1.24)
GFR, Estimated: >60 mL/min (ref >60)
Glucose, Bld: 110 mg/dL — ABNORMAL HIGH (ref 70–99)
Potassium: 4.2 mmol/L (ref 3.5–5.1)
Sodium: 140 mmol/L (ref 135–145)

## 2024-02-18 LAB — HIV ANTIBODY (ROUTINE TESTING W REFLEX): HIV Screen 4th Generation wRfx: NONREACTIVE

## 2024-02-18 MED ORDER — ONDANSETRON HCL 4 MG PO TABS: 4.0000 mg | ORAL_TABLET | Freq: Four times a day (QID) | ORAL | Status: DC | PRN

## 2024-02-18 MED ORDER — HEPARIN SODIUM (PORCINE) 5000 UNIT/ML IJ SOLN
5000.0000 [IU] | Freq: Three times a day (TID) | INTRAMUSCULAR | Status: DC
  Administered 2024-02-18: 5000 [IU] via SUBCUTANEOUS
  Filled 2024-02-18: qty 1

## 2024-02-18 MED ORDER — ACETAMINOPHEN 650 MG RE SUPP: 650.0000 mg | Freq: Four times a day (QID) | RECTAL | Status: DC | PRN

## 2024-02-18 MED ORDER — ACETAMINOPHEN 325 MG PO TABS
650.0000 mg | ORAL_TABLET | Freq: Four times a day (QID) | ORAL | Status: DC | PRN
  Administered 2024-02-18: 650 mg via ORAL
  Filled 2024-02-18: qty 2

## 2024-02-18 MED ORDER — LACTATED RINGERS IV SOLN: INTRAVENOUS | Status: DC

## 2024-02-18 MED ORDER — ONDANSETRON HCL 4 MG/2ML IJ SOLN: 4.0000 mg | Freq: Four times a day (QID) | INTRAMUSCULAR | Status: DC | PRN

## 2024-02-18 MED ORDER — ONDANSETRON 4 MG PO TBDP: 4.0000 mg | ORAL_TABLET | Freq: Three times a day (TID) | ORAL | 0 refills | Status: AC | PRN

## 2024-02-18 MED ORDER — DEXTROSE IN LACTATED RINGERS 5 % IV SOLN: INTRAVENOUS | Status: DC

## 2024-02-18 NOTE — Progress Notes (Signed)
 Subjective: Patient feeling better already this am.  Passing flatus, no BM yet.  No further abdominal pain.  Last pain meds were around midnight last night.  No further N/V.  Drank a half of a cup of water recently and did well with this.  ROS: See above, otherwise other systems negative  Objective: Vital signs in last 24 hours: Temp:  [97.9 F (36.6 C)-98 F (36.7 C)] 98 F (36.7 C) (10/11 0302) Pulse Rate:  [53-117] 117 (10/11 0600) Resp:  [10-32] 16 (10/11 0600) BP: (99-157)/(61-130) 110/79 (10/11 0600) SpO2:  [96 %-100 %] 98 % (10/11 0600) Weight:  [90.7 kg] 90.7 kg (10/10 1829)    Intake/Output from previous day: No intake/output data recorded. Intake/Output this shift: No intake/output data recorded.  PE: Gen: NAD Abd: soft, NT, ND, +BS  Lab Results:  Recent Labs    02/17/24 1833 02/18/24 0540  WBC 13.4* 10.4  HGB 14.8 12.9*  HCT 45.1 39.9  PLT 389 260   BMET Recent Labs    02/17/24 1833 02/18/24 0540  NA 139 140  K 3.9 4.2  CL 103 106  CO2 19* 24  GLUCOSE 137* 110*  BUN 10 10  CREATININE 1.53* 1.29*  CALCIUM 9.7 8.7*   PT/INR No results for input(s): LABPROT, INR in the last 72 hours. CMP     Component Value Date/Time   NA 140 02/18/2024 0540   NA 140 11/17/2020 0000   K 4.2 02/18/2024 0540   CL 106 02/18/2024 0540   CO2 24 02/18/2024 0540   GLUCOSE 110 (H) 02/18/2024 0540   BUN 10 02/18/2024 0540   BUN 9 11/17/2020 0000   CREATININE 1.29 (H) 02/18/2024 0540   CREATININE 1.03 12/21/2019 1520   CALCIUM 8.7 (L) 02/18/2024 0540   PROT 7.8 02/17/2024 1833   ALBUMIN 4.7 02/17/2024 1833   AST 40 02/17/2024 1833   ALT 29 02/17/2024 1833   ALKPHOS 68 02/17/2024 1833   BILITOT 2.1 (H) 02/17/2024 1833   GFRNONAA >60 02/18/2024 0540   GFRAA >60 09/16/2015 1704   Lipase     Component Value Date/Time   LIPASE 31 02/17/2024 1833       Studies/Results: CT ABDOMEN PELVIS W CONTRAST Result Date: 02/17/2024 CLINICAL DATA:   History of Crohn's with history of prior small bowel obstructions and surgery with new onset abdominal pain EXAM: CT ABDOMEN AND PELVIS WITH CONTRAST TECHNIQUE: Multidetector CT imaging of the abdomen and pelvis was performed using the standard protocol following bolus administration of intravenous contrast. RADIATION DOSE REDUCTION: This exam was performed according to the departmental dose-optimization program which includes automated exposure control, adjustment of the mA and/or kV according to patient size and/or use of iterative reconstruction technique. CONTRAST:  75mL OMNIPAQUE  IOHEXOL  350 MG/ML SOLN COMPARISON:  Plain film from earlier in the same day. FINDINGS: Lower chest: No acute abnormality. Hepatobiliary: No focal liver abnormality is seen. No gallstones, gallbladder wall thickening, or biliary dilatation. Pancreas: Unremarkable. No pancreatic ductal dilatation or surrounding inflammatory changes. Spleen: Normal in size without focal abnormality. Adrenals/Urinary Tract: Adrenal glands are within normal limits. Kidneys demonstrate a normal enhancement pattern bilaterally. No renal calculi or obstructive changes are seen. The bladder is partially distended. Stomach/Bowel: Prior changes are noted in the right lower quadrant consistent with distal small bowel resection and reanastomosis to the colon. The appendix is not visualized likely surgically removed during the prior procedure. Small bowel surgical changes are noted as well in the right mid  abdomen. The stomach is within normal limits. Proximal small bowel appears within normal limits. There are dilated loops of central small bowel identified which continue to the level of the small bowel to small bowel anastomosis in the right mid abdomen. An area of narrowing is noted at the anastomotic site best seen on image number 66 of series 6 on the coronal images. These changes are consistent with a partial small bowel obstruction. The more distal small  bowel is within normal limits. Vascular/Lymphatic: No significant vascular findings are present. No enlarged abdominal or pelvic lymph nodes. Reproductive: Prostate is unremarkable. Other: Mild free fluid is noted within the pelvis. No herniation is noted. Musculoskeletal: No acute or significant osseous findings. IMPRESSION: Changes consistent with partial small bowel obstruction in the right mid abdomen at the level of prior small bowel small bowel anastomosis. Narrowing is best seen on the coronal imaging as described. No other focal abnormality is noted. Electronically Signed   By: Oneil Devonshire M.D.   On: 02/17/2024 20:04   DG Abd Portable 1 View Result Date: 02/17/2024 CLINICAL DATA:  Query obstruction. Severe abdominal pain for 4 hours. History of Crohn disease with frequent small-bowel obstructions. EXAM: PORTABLE ABDOMEN - 1 VIEW COMPARISON:  CT abdomen and pelvis 07/05/2021 FINDINGS: Gas-filled distended small bowel in the left upper quadrant likely indicating obstruction. Colon is decompressed. No radiopaque stones. Visualized bones and soft tissue contours appear intact. Lung bases are clear. IMPRESSION: Gaseous distention of left upper quadrant small bowel suggesting obstruction. Electronically Signed   By: Elsie Gravely M.D.   On: 02/17/2024 19:20    Anti-infectives: Anti-infectives (From admission, onward)    None        Assessment/Plan SBO in setting of crohn's disease -clinically patient is feeling much better with flatus and no further N/V -discussed option of oral SBO protocol vs trial of CLD -patient would prefer to move to CLD given the numerous times he has been through this, he feels he will do well. -will continue to monitor   FEN - CLD VTE - heparin ID - none  I reviewed hospitalist notes, last 24 h vitals and pain scores, last 48 h intake and output, last 24 h labs and trends, and last 24 h imaging results.   LOS: 0 days    Burnard FORBES Banter , [facility] 02/18/2024, 7:37 AM Please see Amion for pager number during day hours 7:00am-4:30pm or 7:00am -11:30am on weekends

## 2024-02-18 NOTE — Discharge Summary (Signed)
 Physician Discharge Summary   Patient: Anthony Reed MRN: [id number] DOB: August 06, 1985  Admit date:     02/17/2024  Discharge date: 02/18/24  Discharge Physician: Duffy Al-Sultani   PCP: Kennyth Worth HERO, MD   Discharge Diagnoses: Principal Problem:   Partial small bowel obstruction (HCC) Active Problems:   Crohn's disease with complication (HCC)   WPW (Wolff-Parkinson-White syndrome)   History of Crohn's disease  Resolved Problems:   * No resolved hospital problems. *  Hospital Course: 38 year old male with PMHx significant for Crohn's disease, multiple SBO's (some required surgical intervention), Wolff-Parkinson-White syndrome who presented to the ED on 02/17/2024 with complaints of severe abdominal pain starting around 1 PM associated with nausea and vomiting, similar to prior episodes of SBO.  Last bowel movement was early on on 10/10 and was noted to be watery.  Reportedly has had around 10 small bowel obstructions in the past, some requiring surgical intervention and some resolving with conservative management.   In the ED, he was afebrile, with vitals showing RR 22, HR 65, BP 157/130, SpO2 100% on RA.  CBC showed WBC 13.4, Hgb 14.8, PLT 389.  CMP was remarkable for bicarb 19, creatinine 1.53, T. bili 2.1, anion gap 17.  Lactic acid was elevated to 4.8 with improvement to 0.9 after fluid resuscitation.  KUB showed LUQ small bowel gaseous distention suggesting obstruction. CTAP with contrast showed partial SBO in the right mid abdomen at the level of prior small bowel-small bowel anastomosis.  General surgery was consulted; patient was evaluated by Dr. Lyndel who after discussion with the patient recommended monitoring with IV fluids and bowel rest.   Patient was admitted for observation.  After several hours, he was advanced to clear liquid diet which he tolerated very well.  During the late afternoon, the patient reported feeling well enough to go home.  At the time, he reported  tolerating clear liquid diet without any nausea, vomiting, or abdominal pain.  He was passing flatus though he had not had a bowel movement yet.  The patient insisted that he is quite familiar with the worrisome signs and symptoms of a small bowel obstruction given his many prior episodes and that he felt comfortable going home at this time.  Strict return precautions were communicated to the patient both verbally and in writing.  The patient was hemodynamically stable and in good condition at time of discharge.   Consultants: General surgery Procedures performed: None Disposition: Home Diet recommendation:  Discharge Diet Orders (From admission, onward)     Start     Ordered   02/18/24 0000  Diet clear liquid        02/18/24 1717           Clear liquid diet DISCHARGE MEDICATION: Allergies as of 02/18/2024   No Known Allergies      Medication List     STOP taking these medications    predniSONE  10 MG tablet Commonly known as: DELTASONE        TAKE these medications    acetaminophen  500 MG tablet Commonly known as: TYLENOL  Take 500 mg by mouth every 6 (six) hours as needed for moderate pain, mild pain, fever or headache.   FIBER PO Take by mouth.   FISH OIL PO Take 1 capsule by mouth daily.   loratadine 10 MG tablet Commonly known as: CLARITIN Take 10 mg by mouth daily as needed for itching, rhinitis or allergies.   ondansetron  4 MG disintegrating tablet Commonly known as: ZOFRAN -ODT Take 1 tablet (  4 mg total) by mouth every 8 (eight) hours as needed for up to 5 days for nausea or vomiting.   PROBIOTIC PO Take 1 tablet by mouth daily.   Stelara  90 MG/ML Sosy injection Generic drug: ustekinumab  Inject 90 mg into the skin See admin instructions. INJECT 1 SYRINGE UNDER THE SKIN ONCE EVERY 8 WEEKS. REFRIGERATE. DO NOT FREEZE.        Follow-up Information     Kennyth Worth HERO, MD Follow up in 1 week(s).   Specialty: Family Medicine Contact  information: [address] [place] [address] [phone]                Discharge Exam: Fredricka Weights   02/17/24 1829  Weight: 90.7 kg   Physical Exam Constitutional:      General: He is not in acute distress.    Appearance: He is not ill-appearing.  HENT:     Mouth/Throat:     Mouth: Mucous membranes are moist.  Eyes:     Pupils: Pupils are equal, round, and reactive to light.  Cardiovascular:     Rate and Rhythm: Normal rate and regular rhythm.     Heart sounds: Normal heart sounds. No murmur heard. Pulmonary:     Effort: Pulmonary effort is normal. No respiratory distress.     Breath sounds: Normal breath sounds. No wheezing.  Abdominal:     General: Bowel sounds are normal. There is no distension.     Palpations: Abdomen is soft.     Tenderness: There is no abdominal tenderness. There is no guarding.  Musculoskeletal:     Right lower leg: No edema.     Left lower leg: No edema.  Skin:    General: Skin is warm and dry.     Capillary Refill: Capillary refill takes less than 2 seconds.  Neurological:     Mental Status: He is alert and oriented to person, place, and time. Mental status is at baseline.      Condition at discharge: good  The results of significant diagnostics from this hospitalization (including imaging, microbiology, ancillary and laboratory) are listed below for reference.   Imaging Studies: CT ABDOMEN PELVIS W CONTRAST Result Date: 02/17/2024 CLINICAL DATA:  History of Crohn's with history of prior small bowel obstructions and surgery with new onset abdominal pain EXAM: CT ABDOMEN AND PELVIS WITH CONTRAST TECHNIQUE: Multidetector CT imaging of the abdomen and pelvis was performed using the standard protocol following bolus administration of intravenous contrast. RADIATION DOSE REDUCTION: This exam was performed according to the departmental dose-optimization program which includes automated exposure control, adjustment of the mA and/or kV  according to patient size and/or use of iterative reconstruction technique. CONTRAST:  75mL OMNIPAQUE  IOHEXOL  350 MG/ML SOLN COMPARISON:  Plain film from earlier in the same day. FINDINGS: Lower chest: No acute abnormality. Hepatobiliary: No focal liver abnormality is seen. No gallstones, gallbladder wall thickening, or biliary dilatation. Pancreas: Unremarkable. No pancreatic ductal dilatation or surrounding inflammatory changes. Spleen: Normal in size without focal abnormality. Adrenals/Urinary Tract: Adrenal glands are within normal limits. Kidneys demonstrate a normal enhancement pattern bilaterally. No renal calculi or obstructive changes are seen. The bladder is partially distended. Stomach/Bowel: Prior changes are noted in the right lower quadrant consistent with distal small bowel resection and reanastomosis to the colon. The appendix is not visualized likely surgically removed during the prior procedure. Small bowel surgical changes are noted as well in the right mid abdomen. The stomach is within normal limits. Proximal small bowel  appears within normal limits. There are dilated loops of central small bowel identified which continue to the level of the small bowel to small bowel anastomosis in the right mid abdomen. An area of narrowing is noted at the anastomotic site best seen on image number 66 of series 6 on the coronal images. These changes are consistent with a partial small bowel obstruction. The more distal small bowel is within normal limits. Vascular/Lymphatic: No significant vascular findings are present. No enlarged abdominal or pelvic lymph nodes. Reproductive: Prostate is unremarkable. Other: Mild free fluid is noted within the pelvis. No herniation is noted. Musculoskeletal: No acute or significant osseous findings. IMPRESSION: Changes consistent with partial small bowel obstruction in the right mid abdomen at the level of prior small bowel small bowel anastomosis. Narrowing is best seen on  the coronal imaging as described. No other focal abnormality is noted. Electronically Signed   By: Oneil Devonshire M.D.   On: 02/17/2024 20:04   DG Abd Portable 1 View Result Date: 02/17/2024 CLINICAL DATA:  Query obstruction. Severe abdominal pain for 4 hours. History of Crohn disease with frequent small-bowel obstructions. EXAM: PORTABLE ABDOMEN - 1 VIEW COMPARISON:  CT abdomen and pelvis 07/05/2021 FINDINGS: Gas-filled distended small bowel in the left upper quadrant likely indicating obstruction. Colon is decompressed. No radiopaque stones. Visualized bones and soft tissue contours appear intact. Lung bases are clear. IMPRESSION: Gaseous distention of left upper quadrant small bowel suggesting obstruction. Electronically Signed   By: Elsie Gravely M.D.   On: 02/17/2024 19:20    Microbiology: Results for orders placed or performed during the hospital encounter of 07/05/21  Resp Panel by RT-PCR (Flu A&B, Covid) Nasopharyngeal Swab     Status: None   Collection Time: 07/05/21  1:30 PM   Specimen: Nasopharyngeal Swab; Nasopharyngeal(NP) swabs in vial transport medium  Result Value Ref Range Status   SARS Coronavirus 2 by RT PCR NEGATIVE NEGATIVE Final    Comment: (NOTE) SARS-CoV-2 target nucleic acids are NOT DETECTED.  The SARS-CoV-2 RNA is generally detectable in upper respiratory specimens during the acute phase of infection. The lowest concentration of SARS-CoV-2 viral copies this assay can detect is 138 copies/mL. A negative result does not preclude SARS-Cov-2 infection and should not be used as the sole basis for treatment or other patient management decisions. A negative result may occur with  improper specimen collection/handling, submission of specimen other than nasopharyngeal swab, presence of viral mutation(s) within the areas targeted by this assay, and inadequate number of viral copies(<138 copies/mL). A negative result must be combined with clinical observations, patient  history, and epidemiological information. The expected result is Negative.  Fact Sheet for Patients:  BloggerCourse.com  Fact Sheet for Healthcare Providers:  SeriousBroker.it  This test is no t yet approved or cleared by the [place]  FDA and  has been authorized for detection and/or diagnosis of SARS-CoV-2 by FDA under an Emergency Use Authorization (EUA). This EUA will remain  in effect (meaning this test can be used) for the duration of the COVID-19 declaration under Section 564(b)(1) of the Act, 21 U.S.C.section 360bbb-3(b)(1), unless the authorization is terminated  or revoked sooner.       Influenza A by PCR NEGATIVE NEGATIVE Final   Influenza B by PCR NEGATIVE NEGATIVE Final    Comment: (NOTE) The Xpert Xpress SARS-CoV-2/FLU/RSV plus assay is intended as an aid in the diagnosis of influenza from Nasopharyngeal swab specimens and should not be used as a sole basis for treatment. Nasal washings  and aspirates are unacceptable for Xpert Xpress SARS-CoV-2/FLU/RSV testing.  Fact Sheet for Patients: BloggerCourse.com  Fact Sheet for Healthcare Providers: SeriousBroker.it  This test is not yet approved or cleared by the [place]  FDA and has been authorized for detection and/or diagnosis of SARS-CoV-2 by FDA under an Emergency Use Authorization (EUA). This EUA will remain in effect (meaning this test can be used) for the duration of the COVID-19 declaration under Section 564(b)(1) of the Act, 21 U.S.C. section 360bbb-3(b)(1), unless the authorization is terminated or revoked.  Performed at [facility], [address] [place], [place] [address]     Labs: CBC: Recent Labs  Lab 02/17/24 1833 02/18/24 0540  WBC 13.4* 10.4  HGB 14.8 12.9*  HCT 45.1 39.9  MCV 77.6* 80.6  PLT 389 260   Basic Metabolic Panel: Recent Labs  Lab  02/17/24 1833 02/18/24 0540  NA 139 140  K 3.9 4.2  CL 103 106  CO2 19* 24  GLUCOSE 137* 110*  BUN 10 10  CREATININE 1.53* 1.29*  CALCIUM 9.7 8.7*   Liver Function Tests: Recent Labs  Lab 02/17/24 1833  AST 40  ALT 29  ALKPHOS 68  BILITOT 2.1*  PROT 7.8  ALBUMIN 4.7   CBG: No results for input(s): GLUCAP in the last 168 hours.  Discharge time spent: 35 minutes.  Signed: Kennya Schwenn Al-Sultani, MD [facility] 02/18/2024

## 2024-02-18 NOTE — ED Notes (Signed)
 CCMD called for this patient

## 2024-02-18 NOTE — Discharge Instructions (Addendum)
 You have been diagnosed with a partial small bowel obstruction (SBO), which means there is a partial blockage in your small intestine. The obstruction is now resolving, and you are being discharged from the hospital. However, it's important to continue monitoring your symptoms and follow the instructions below to ensure a full recovery.  Diet and Nutrition:  1) Clear Liquids: - For the next 24-48 hours, stick to clear liquids such as water, broth, clear juice (without pulp), and electrolyte drinks. - Avoid caffeinated or sugary beverages as they can worsen dehydration.  2) Gradual Diet Advancement: - Once tolerated, you may gradually start introducing low-fiber foods, such as plain crackers, white rice, bananas, or plain pasta. - Gradually increase your intake to solid foods as tolerated over the next 1-2 days. - Avoid high-fiber, greasy, or spicy foods initially, as they can irritate your digestive system.  3) Small, Frequent Meals: - Eat smaller meals more frequently (every 2-3 hours) to prevent overwhelming your digestive system. Avoid large, heavy meals at once. - Drink plenty of fluids to stay hydrated, especially if you've had vomiting or diarrhea.  4) Avoid Dairy and Gas-Producing Foods: - For the first few days, avoid dairy, beans, broccoli, cabbage, and carbonated beverages as they may cause gas and bloating.  Medications:  1. Pain Relief: - Take any prescribed pain medications as directed. Do not exceed the recommended dose. - Use over-the-counter medications like acetaminophen  (Tylenol ) for mild pain, but avoid nonsteroidal anti-inflammatory drugs (NSAIDs) like ibuprofen unless directed otherwise.  2. Antibiotics (If Prescribed): - If you were prescribed antibiotics, finish the entire course even if you feel better.  3. Nausea/Vomiting Medications: - If you were given medications for nausea or vomiting, use them as directed if symptoms return.  Activity:  1. Rest and  Gradual Activity: - Take it easy for the first few days and avoid strenuous physical activity or heavy lifting. - You can resume light activities such as walking around your house, but avoid any vigorous exercise for at least 1-2 weeks.  2. Avoid Bending or Straining: - Do not bend over or strain during bowel movements, as this can put unnecessary pressure on your abdomen.  Monitoring and Symptoms to Watch For:  1. Watch for the Return of Symptoms: - If you experience any of the following symptoms, contact your healthcare provider immediately:    - Severe abdominal pain that does not improve with rest    - Nausea and vomiting that is worsening or persistent    - Inability to pass gas or stool for more than 24 hours    - Fever, chills, or signs of infection (e.g., redness, swelling at the surgical site, or increasing pain)    - Swelling or distension of the abdomen  2. Bowel Movements: - You should start passing gas and having bowel movements within a few days after discharge. If you do not have a bowel movement within 48 hours, call your doctor.  3. Hydration: - Stay well-hydrated to help keep your digestive system functioning properly. If you have trouble keeping fluids down, call your doctor.  Follow-Up Care: 1. Schedule Follow-Up Appointment: - You should have a follow-up appointment with your physician or gastroenterologist in the next 1-2 weeks to monitor your progress and ensure that there are no complications. Please call to schedule this appointment if one has not been set.  2. Keep Track of Your Symptoms: - Record any changes in your symptoms, diet, or activity level and bring this information to your  follow-up visit.  Emergency Contact:  If you experience any of the following, call 911 or go to the nearest emergency room: - Sudden, severe abdominal pain that doesn't improve - Inability to pass stool or gas for more than 24 hours - Vomiting that is worsening or  persistent - Fever, chills, or signs of infection

## 2024-02-18 NOTE — Hospital Course (Addendum)
 38 year old male with PMHx significant for Crohn's disease, multiple SBO's (some required surgical intervention), Wolff-Parkinson-White syndrome who presented to the ED on 02/17/2024 with complaints of severe abdominal pain starting around 1 PM associated with nausea and vomiting, similar to prior episodes of SBO.  Last bowel movement was early on on 10/10 and was noted to be watery.  Reportedly has had around 10 small bowel obstructions in the past, some requiring surgical intervention and some resolving with conservative management.   In the ED, he was afebrile, with vitals showing RR 22, HR 65, BP 157/130, SpO2 100% on RA.  CBC showed WBC 13.4, Hgb 14.8, PLT 389.  CMP was remarkable for bicarb 19, creatinine 1.53, T. bili 2.1, anion gap 17.  Lactic acid was elevated to 4.8 with improvement to 0.9 after fluid resuscitation.  KUB showed LUQ small bowel gaseous distention suggesting obstruction. CTAP with contrast showed partial SBO in the right mid abdomen at the level of prior small bowel-small bowel anastomosis.  General surgery was consulted; patient was evaluated by Dr. Lyndel who after discussion with the patient recommended monitoring with IV fluids and bowel rest.   Patient was admitted for observation.  After several hours, he was advanced to clear liquid diet which he tolerated very well.  During the late afternoon, the patient reported feeling well enough to go home.  At the time, he reported tolerating clear liquid diet without any nausea, vomiting, or abdominal pain.  He was passing flatus though he had not had a bowel movement yet.  The patient insisted that he is quite familiar with the worrisome signs and symptoms of a small bowel obstruction given his many prior episodes and that he felt comfortable going home at this time.  Strict return precautions were communicated to the patient both verbally and in writing.  The patient was hemodynamically stable and in good condition at time of  discharge.

## 2024-02-18 NOTE — Progress Notes (Signed)
 Patient refused heparin injection he stated he received one this morning. Also, he is ambulating in the room and waiting for Dr. Duffy Al-Sutani to come by because he wants to be discharged home. Patient feels much better and says he believes the issues has been resolved. Patient is ingesting clear liquids with no complaints of n/v or abdominal pain. Dr. Anmar Al-Sultani is aware of the patient's request and plans to come see the patient today.

## 2024-02-18 NOTE — Progress Notes (Addendum)
 Norman Hacking to be D/C'd  per MD order.  Discussed with the patient and all questions fully answered.  VSS, Skin clean, dry and intact without evidence of skin break down, no evidence of skin tears noted.  IV catheter discontinued intact. Site without signs and symptoms of complications. Dressing and pressure applied.  An After Visit Summary was printed and given to the patient. Patient instructed to pick up prescription at preferred pharmacy.  D/c education completed with patient/family including follow up instructions, medication list, d/c activities limitations if indicated, with other d/c instructions as indicated by MD - patient able to verbalize understanding, all questions fully answered.   Patient instructed to return to ED, call 911, or call MD for any changes in condition.   Patient ambulated independently, and D/C home via private auto.
